# Patient Record
Sex: Male | Born: 1965 | Race: White | Hispanic: No | Marital: Married | Smoking: Former smoker
Health system: Southern US, Community
[De-identification: ages and names within clinical notes are randomized; demographics above are authoritative.]

## PROBLEM LIST (undated history)

## (undated) DIAGNOSIS — I2510 Atherosclerotic heart disease of native coronary artery without angina pectoris: Secondary | ICD-10-CM

## (undated) DIAGNOSIS — M5126 Other intervertebral disc displacement, lumbar region: Secondary | ICD-10-CM

## (undated) DIAGNOSIS — M5414 Radiculopathy, thoracic region: Secondary | ICD-10-CM

## (undated) DIAGNOSIS — E119 Type 2 diabetes mellitus without complications: Secondary | ICD-10-CM

## (undated) DIAGNOSIS — G629 Polyneuropathy, unspecified: Secondary | ICD-10-CM

## (undated) DIAGNOSIS — E785 Hyperlipidemia, unspecified: Secondary | ICD-10-CM

## (undated) DIAGNOSIS — G4730 Sleep apnea, unspecified: Secondary | ICD-10-CM

## (undated) DIAGNOSIS — M549 Dorsalgia, unspecified: Secondary | ICD-10-CM

## (undated) DIAGNOSIS — M5417 Radiculopathy, lumbosacral region: Secondary | ICD-10-CM

## (undated) DIAGNOSIS — R945 Abnormal results of liver function studies: Secondary | ICD-10-CM

## (undated) DIAGNOSIS — M48061 Spinal stenosis, lumbar region without neurogenic claudication: Secondary | ICD-10-CM

## (undated) DIAGNOSIS — F329 Major depressive disorder, single episode, unspecified: Secondary | ICD-10-CM

## (undated) DIAGNOSIS — M5137 Other intervertebral disc degeneration, lumbosacral region: Secondary | ICD-10-CM

## (undated) DIAGNOSIS — I10 Essential (primary) hypertension: Secondary | ICD-10-CM

## (undated) HISTORY — DX: Dorsalgia, unspecified: M54.9

## (undated) HISTORY — DX: Radiculopathy, thoracic region: M54.14

## (undated) HISTORY — DX: Essential (primary) hypertension: I10

## (undated) HISTORY — DX: Other intervertebral disc degeneration, lumbosacral region: M51.37

## (undated) HISTORY — DX: Radiculopathy, lumbosacral region: M54.17

## (undated) HISTORY — DX: Type 2 diabetes mellitus without complications: E11.9

## (undated) HISTORY — DX: Spinal stenosis, lumbar region without neurogenic claudication: M48.061

## (undated) HISTORY — DX: Other intervertebral disc displacement, lumbar region: M51.26

## (undated) HISTORY — DX: Major depressive disorder, single episode, unspecified: F32.9

## (undated) HISTORY — PX: OTHER SURGICAL HISTORY: SHX169

## (undated) HISTORY — DX: Polyneuropathy, unspecified: G62.9

## (undated) HISTORY — DX: Hyperlipidemia, unspecified: E78.5

## (undated) HISTORY — DX: Abnormal results of liver function studies: R94.5

## (undated) HISTORY — DX: Atherosclerotic heart disease of native coronary artery without angina pectoris: I25.10

---

## 1998-08-23 HISTORY — PX: BACK SURGERY: SHX140

## 1999-02-26 ENCOUNTER — Inpatient Hospital Stay (HOSPITAL_COMMUNITY): Admission: RE | Admit: 1999-02-26 | Discharge: 1999-02-27 | Payer: Self-pay | Admitting: Neurosurgery

## 1999-02-26 ENCOUNTER — Encounter: Payer: Self-pay | Admitting: Neurosurgery

## 2004-08-23 HISTORY — PX: OTHER SURGICAL HISTORY: SHX169

## 2004-09-18 ENCOUNTER — Ambulatory Visit: Payer: Self-pay

## 2004-09-25 ENCOUNTER — Ambulatory Visit: Payer: Self-pay | Admitting: Unknown Physician Specialty

## 2005-01-17 ENCOUNTER — Emergency Department: Payer: Self-pay | Admitting: Emergency Medicine

## 2006-01-23 ENCOUNTER — Emergency Department: Payer: Self-pay | Admitting: Emergency Medicine

## 2008-12-03 ENCOUNTER — Emergency Department: Payer: Self-pay | Admitting: Unknown Physician Specialty

## 2009-07-06 ENCOUNTER — Emergency Department: Payer: Self-pay | Admitting: Emergency Medicine

## 2011-05-16 ENCOUNTER — Emergency Department: Payer: Self-pay | Admitting: Emergency Medicine

## 2011-06-07 ENCOUNTER — Observation Stay: Payer: Self-pay | Admitting: Internal Medicine

## 2011-06-08 ENCOUNTER — Emergency Department: Payer: Self-pay | Admitting: Internal Medicine

## 2011-06-17 ENCOUNTER — Ambulatory Visit: Payer: Self-pay | Admitting: Cardiology

## 2011-06-17 HISTORY — PX: CORONARY ANGIOPLASTY WITH STENT PLACEMENT: SHX49

## 2011-11-23 ENCOUNTER — Ambulatory Visit: Payer: Self-pay | Admitting: General Practice

## 2011-12-21 ENCOUNTER — Ambulatory Visit: Payer: Self-pay | Admitting: General Practice

## 2012-11-13 ENCOUNTER — Emergency Department: Payer: Self-pay | Admitting: Emergency Medicine

## 2012-11-15 ENCOUNTER — Ambulatory Visit: Payer: Self-pay | Admitting: Orthopedic Surgery

## 2012-12-04 DIAGNOSIS — E785 Hyperlipidemia, unspecified: Secondary | ICD-10-CM

## 2012-12-04 DIAGNOSIS — R7989 Other specified abnormal findings of blood chemistry: Secondary | ICD-10-CM | POA: Insufficient documentation

## 2012-12-04 DIAGNOSIS — F329 Major depressive disorder, single episode, unspecified: Secondary | ICD-10-CM | POA: Insufficient documentation

## 2012-12-04 DIAGNOSIS — G629 Polyneuropathy, unspecified: Secondary | ICD-10-CM | POA: Insufficient documentation

## 2012-12-04 DIAGNOSIS — E119 Type 2 diabetes mellitus without complications: Secondary | ICD-10-CM | POA: Insufficient documentation

## 2012-12-04 DIAGNOSIS — R569 Unspecified convulsions: Secondary | ICD-10-CM | POA: Insufficient documentation

## 2012-12-04 DIAGNOSIS — M545 Low back pain, unspecified: Secondary | ICD-10-CM | POA: Insufficient documentation

## 2012-12-04 DIAGNOSIS — I2510 Atherosclerotic heart disease of native coronary artery without angina pectoris: Secondary | ICD-10-CM

## 2012-12-04 DIAGNOSIS — R945 Abnormal results of liver function studies: Secondary | ICD-10-CM

## 2012-12-04 DIAGNOSIS — I10 Essential (primary) hypertension: Secondary | ICD-10-CM

## 2012-12-04 DIAGNOSIS — E871 Hypo-osmolality and hyponatremia: Secondary | ICD-10-CM | POA: Insufficient documentation

## 2012-12-04 DIAGNOSIS — F32A Depression, unspecified: Secondary | ICD-10-CM | POA: Insufficient documentation

## 2012-12-04 DIAGNOSIS — M549 Dorsalgia, unspecified: Secondary | ICD-10-CM | POA: Insufficient documentation

## 2012-12-04 HISTORY — DX: Dorsalgia, unspecified: M54.9

## 2012-12-04 HISTORY — DX: Essential (primary) hypertension: I10

## 2012-12-04 HISTORY — DX: Other specified abnormal findings of blood chemistry: R79.89

## 2012-12-04 HISTORY — DX: Abnormal results of liver function studies: R94.5

## 2012-12-04 HISTORY — DX: Depression, unspecified: F32.A

## 2012-12-04 HISTORY — DX: Atherosclerotic heart disease of native coronary artery without angina pectoris: I25.10

## 2012-12-04 HISTORY — DX: Hyperlipidemia, unspecified: E78.5

## 2012-12-04 HISTORY — DX: Polyneuropathy, unspecified: G62.9

## 2012-12-11 DIAGNOSIS — M51379 Other intervertebral disc degeneration, lumbosacral region without mention of lumbar back pain or lower extremity pain: Secondary | ICD-10-CM

## 2012-12-11 DIAGNOSIS — M5137 Other intervertebral disc degeneration, lumbosacral region: Secondary | ICD-10-CM

## 2012-12-11 DIAGNOSIS — M5126 Other intervertebral disc displacement, lumbar region: Secondary | ICD-10-CM | POA: Insufficient documentation

## 2012-12-11 DIAGNOSIS — M5414 Radiculopathy, thoracic region: Secondary | ICD-10-CM

## 2012-12-11 DIAGNOSIS — M48061 Spinal stenosis, lumbar region without neurogenic claudication: Secondary | ICD-10-CM | POA: Insufficient documentation

## 2012-12-11 DIAGNOSIS — IMO0002 Reserved for concepts with insufficient information to code with codable children: Secondary | ICD-10-CM | POA: Insufficient documentation

## 2012-12-11 HISTORY — DX: Other intervertebral disc degeneration, lumbosacral region: M51.37

## 2012-12-11 HISTORY — DX: Other intervertebral disc displacement, lumbar region: M51.26

## 2012-12-11 HISTORY — DX: Radiculopathy, thoracic region: M54.14

## 2012-12-11 HISTORY — DX: Other intervertebral disc degeneration, lumbosacral region without mention of lumbar back pain or lower extremity pain: M51.379

## 2012-12-11 HISTORY — DX: Spinal stenosis, lumbar region without neurogenic claudication: M48.061

## 2012-12-15 HISTORY — PX: OTHER SURGICAL HISTORY: SHX169

## 2013-06-26 ENCOUNTER — Ambulatory Visit: Payer: Self-pay | Admitting: Physician Assistant

## 2013-07-23 ENCOUNTER — Ambulatory Visit: Payer: Self-pay | Admitting: Physician Assistant

## 2013-08-01 ENCOUNTER — Ambulatory Visit: Payer: Self-pay | Admitting: General Practice

## 2013-08-23 ENCOUNTER — Ambulatory Visit: Payer: Self-pay | Admitting: Physician Assistant

## 2014-02-15 ENCOUNTER — Ambulatory Visit: Payer: Self-pay | Admitting: Podiatry

## 2014-02-22 ENCOUNTER — Other Ambulatory Visit: Payer: Self-pay | Admitting: *Deleted

## 2014-02-22 ENCOUNTER — Ambulatory Visit (INDEPENDENT_AMBULATORY_CARE_PROVIDER_SITE_OTHER): Payer: No Typology Code available for payment source | Admitting: Podiatry

## 2014-02-22 ENCOUNTER — Ambulatory Visit (INDEPENDENT_AMBULATORY_CARE_PROVIDER_SITE_OTHER): Payer: No Typology Code available for payment source

## 2014-02-22 ENCOUNTER — Encounter: Payer: Self-pay | Admitting: Podiatry

## 2014-02-22 VITALS — BP 121/83 | HR 97 | Resp 16 | Ht 72.0 in | Wt 245.0 lb

## 2014-02-22 DIAGNOSIS — M779 Enthesopathy, unspecified: Secondary | ICD-10-CM

## 2014-02-22 DIAGNOSIS — M2040 Other hammer toe(s) (acquired), unspecified foot: Secondary | ICD-10-CM

## 2014-02-22 DIAGNOSIS — M2010 Hallux valgus (acquired), unspecified foot: Secondary | ICD-10-CM

## 2014-02-22 MED ORDER — TRIAMCINOLONE ACETONIDE 10 MG/ML IJ SUSP
10.0000 mg | Freq: Once | INTRAMUSCULAR | Status: AC
  Administered 2014-02-22: 10 mg

## 2014-02-22 MED ORDER — DICLOFENAC SODIUM 75 MG PO TBEC: 75.0000 mg | DELAYED_RELEASE_TABLET | Freq: Two times a day (BID) | ORAL | Status: DC

## 2014-02-22 NOTE — Progress Notes (Signed)
   Subjective:    Patient ID: Christopher Dennis, male    DOB: 1965/11/09, 48 y.o.   MRN: [id number]  HPI Comments: Both of my feet hurt hurt in the front and back. 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 My feet are getting worse. It hurts when i stand, shoes hurt. i have not done anything for my feet.  Foot Pain Associated symptoms include fatigue and a rash.      Review of Systems  Constitutional: Positive for appetite change, fatigue and unexpected weight change.       Sweating  Musculoskeletal:       Joint pain Back pain Difficulty walking  Skin: Positive for rash.       Change in nails  Allergic/Immunologic: Positive for environmental allergies.  Hematological:       Slow to heal  All other systems reviewed and are negative.      Objective:   Physical Exam        Assessment & Plan:

## 2014-02-22 NOTE — Progress Notes (Signed)
Subjective:     Patient ID: Christopher Dennis, male   DOB: 1965-12-28, 48 y.o.   MRN: [id number]  Foot Pain   patient states my left foot is really hurting me and both feet hurt me at times. States the second toe has lifted in the last few months and has making the pain worse in my forefoot and I have a long family history of this problem and also the bunion is becoming sore   Review of Systems  All other systems reviewed and are negative.      Objective:   Physical Exam  Nursing note and vitals reviewed. Constitutional: He is oriented to person, place, and time.  Cardiovascular: Intact distal pulses.   Musculoskeletal: Normal range of motion.  Neurological: He is oriented to person, place, and time.  Skin: Skin is warm.   neurovascular status is found to be intact with muscle strength adequate and range of motion subtalar midtarsal joint within normal limits. 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 Today I focused in on the second MPJ and I did do a proximal nerve block aspirated the joint giving out a small amount of clear fluid and injected with half cc of dexamethasone Kenalog combination and applied thick metatarsal pad. Did discuss ultimate surgery with digital fusion digits 234 structural bunion correction and probable shortening osteotomy the second metatarsal left. Reappoint in 3 weeks to discuss reaction to the medication and whether it helped and  whether orthotics or possible surgery is going to be in the near future

## 2014-03-15 ENCOUNTER — Ambulatory Visit: Payer: No Typology Code available for payment source | Admitting: Podiatry

## 2014-08-09 ENCOUNTER — Ambulatory Visit: Payer: Self-pay | Admitting: Orthopedic Surgery

## 2014-09-12 ENCOUNTER — Ambulatory Visit: Payer: Self-pay | Admitting: Orthopedic Surgery

## 2014-09-12 DIAGNOSIS — I10 Essential (primary) hypertension: Secondary | ICD-10-CM

## 2014-09-12 LAB — CBC WITH DIFFERENTIAL/PLATELET
BASOS ABS: 0.1 10*3/uL (ref 0.0–0.1)
Basophil %: 0.9 %
Eosinophil #: 0.2 10*3/uL (ref 0.0–0.7)
Eosinophil %: 2.9 %
HCT: 46.4 % (ref 40.0–52.0)
HGB: 15.1 g/dL (ref 13.0–18.0)
LYMPHS ABS: 2.1 10*3/uL (ref 1.0–3.6)
Lymphocyte %: 33.5 %
MCH: 30.4 pg (ref 26.0–34.0)
MCHC: 32.4 g/dL (ref 32.0–36.0)
MCV: 94 fL (ref 80–100)
MONOS PCT: 7.4 %
Monocyte #: 0.5 x10 3/mm (ref 0.2–1.0)
Neutrophil #: 3.4 10*3/uL (ref 1.4–6.5)
Neutrophil %: 55.3 %
Platelet: 142 10*3/uL — ABNORMAL LOW (ref 150–440)
RBC: 4.95 10*6/uL (ref 4.40–5.90)
RDW: 13 % (ref 11.5–14.5)
WBC: 6.1 10*3/uL (ref 3.8–10.6)

## 2014-09-19 ENCOUNTER — Ambulatory Visit: Payer: Self-pay | Admitting: Orthopedic Surgery

## 2014-09-19 HISTORY — PX: KNEE ARTHROSCOPY: SUR90

## 2014-11-22 ENCOUNTER — Emergency Department
Admit: 2014-11-22 | Disposition: A | Discharge status: Home / Self Care | Payer: Self-pay | Admitting: Emergency Medicine

## 2014-12-22 NOTE — Op Note (Signed)
PATIENT NAME:  Christopher Dennis, DORO MR#:  [id number] DATE OF BIRTH:  December 02, 1965  DATE OF PROCEDURE:  09/19/2014  PREOPERATIVE DIAGNOSIS:  Right knee osteoarthritis, medial and lateral meniscus tears.   POSTOPERATIVE DIAGNOSIS:  Right knee osteoarthritis, medial and lateral meniscus tears.   PROCEDURE:  Right knee arthroscopy, partial medial and extensive lateral meniscectomy.   ANESTHESIA:  General.   SURGEON:  Laurene Footman, MD   DESCRIPTION OF PROCEDURE:  The patient was brought to the operating room, and after adequate anesthesia was obtained, the right leg was prepped and draped in the usual sterile fashion with the arthroscopic leg holder and tourniquet applied to the right upper thigh.  After patient identification and timeout procedure were completed, an inferolateral portal was made and the arthroscope was introduced. Initial inspection revealed normal patellofemoral joint with mild synovitis in the suprapatellar pouch. No significant plica band. Coming around medially, an inferomedial portal was made and on probing there was a tear of the junction of the middle and posterior thirds consistent with the MRI findings with areas of mild partial-thickness cartilage loss on the femoral condyle and minimal on the tibial condyle. The ACL was intact. The lateral compartment had essentially a bucket-handle tear of the anterior horn of the lateral meniscus, a horizontal tear in the posterior third, and an extensive tear in the middle third as well, which was more radial. After using first a meniscal punch, a shaver was used to debride large fragments and then a wand used to smooth the edges. After adequate resection of both meniscus tears, the gutters were checked. There were no loose bodies. The knee was irrigated until clear. Additionally, in the lateral condyle, there was extensive fissuring and grade 3 changes to both femoral and tibial condyles, especially in the central tibia. Preoperative and  postoperative pictures were taken. The arthroscope was withdrawn along with all instrumentation. The wounds were closed with simple interrupted 4-0 nylon, and 20 mL of 0.5% Sensorcaine were infiltrated in the portals for postoperative analgesia. Xeroform, 4 x 4's, Webril, and Ace wrap were applied. The patient was sent to the recovery room in stable condition.   ESTIMATED BLOOD LOSS:  Minimal.   COMPLICATIONS:  None.   SPECIMEN:  None.    ____________________________ Laurene Footman, MD mjm:nb D: 09/19/2014 19:07:14 ET T: 09/20/2014 02:25:31 ET JOB#: [id number]  cc: Laurene Footman, MD, <Dictator> Laurene Footman MD ELECTRONICALLY SIGNED 09/20/2014 10:01

## 2015-04-29 ENCOUNTER — Encounter: Payer: Self-pay | Admitting: Gastroenterology

## 2015-04-29 ENCOUNTER — Telehealth: Payer: Self-pay

## 2015-04-29 ENCOUNTER — Ambulatory Visit (INDEPENDENT_AMBULATORY_CARE_PROVIDER_SITE_OTHER): Payer: PRIVATE HEALTH INSURANCE | Admitting: Gastroenterology

## 2015-04-29 VITALS — BP 121/83 | HR 101 | Temp 98.3°F | Ht 72.0 in | Wt 241.0 lb

## 2015-04-29 DIAGNOSIS — R1314 Dysphagia, pharyngoesophageal phase: Secondary | ICD-10-CM

## 2015-04-29 MED ORDER — NA SULFATE-K SULFATE-MG SULF 17.5-3.13-1.6 GM/177ML PO SOLN: 1.0000 | ORAL | Status: DC

## 2015-04-29 NOTE — Telephone Encounter (Signed)
Can you please schedule his Screening Colonoscopy and EGD for Dysphagia. I was able to send his prescription but not able to order or schedule his procedures.  I gave him his paperwork and explained procedures. I told him that you would call him with the appointment date. Thank you.

## 2015-04-29 NOTE — Progress Notes (Signed)
Gastroenterology Consultation  Referring Provider:     Versie Starks, PA-C Primary Care Physician:  Versie Starks, PA-C Primary Gastroenterologist:  Dr. Allen Norris     Reason for Consultation:     Dysphagia        HPI:   Christopher Dennis is a 50 y.o. y/o male referred for consultation & management of dysphagia by Dr. Versie Starks, PA-C.  This patient comes in today with a history of dysphagia. The patient reports that is worse with pulled pork and gravy but usually not with soft foods. The patient states this is been going on for many years. He denies ever having to go to the emergency room or having upper endoscopy to evaluate this. The patient states that sometimes he has to go to the bathroom and threw up the food. 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 There is no report of any black stools or bloody stools. The patient also reports that he has never had a colonoscopy. The patient denies any unexplained weight loss all although he has lost 11 pounds over the last year but he states he was trying. He denies any abdominal pain associated with his dysphagia. He also denies any episodes of choking where he loses his breath.  Past Medical History  Diagnosis Date  . Diabetes   . Arteriosclerosis of coronary artery 12/04/2012  . BP (high blood pressure) 12/04/2012  . Neuropathy 12/04/2012  . 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 DDD (degenerative disc disease), lumbosacral 12/11/2012  . Displacement of lumbar intervertebral disc without myelopathy 12/11/2012  . Back ache 12/04/2012  . Clinical depression 12/04/2012  . Abnormal LFTs 12/04/2012  . HLD (hyperlipidemia) 12/04/2012  . Lumbar canal stenosis 12/11/2012    Past Surgical History  Procedure Laterality Date  . Back surgery  2000  . Meniscus tear  2006  . Coronary angioplasty with stent placement    . Laminectomy posterior lumbar facetectomy & foraminotomy w/decomp  12/15/2012  . Laminectomy posterior  cervicle decomp    . Knee arthroscopy Right 09/19/2014  . Partial medial and extensive lateral meniscectomy      Prior to Admission medications   Medication Sig Start Date End Date Taking? Authorizing Provider  clopidogrel (PLAVIX) 75 MG tablet Take 75 mg by mouth daily.   Yes Historical Provider, MD  glipiZIDE (GLUCOTROL) 5 MG tablet Take 1 tablet by mouth 2 (two) times daily. 02/28/15  Yes Historical Provider, MD  lisinopril (PRINIVIL,ZESTRIL) 20 MG tablet Take 1 tablet by mouth 1 day or 1 dose. 04/09/15  Yes Historical Provider, MD  Loratadine 10 MG CAPS Take by mouth.   Yes Historical Provider, MD  metFORMIN (GLUCOPHAGE) 1000 MG tablet Take by mouth.   Yes Historical Provider, MD  simvastatin (ZOCOR) 20 MG tablet Take by mouth.   Yes Historical Provider, MD  Vitamin D, Ergocalciferol, (DRISDOL) 50000 UNITS CAPS capsule Take by mouth.   Yes Historical Provider, MD    Family History  Problem Relation Age of Onset  . Diabetes Mother   . COPD Mother   . Alcohol abuse Father   . Asthma Sister      Social History  Substance Use Topics  . Smoking status: Former Smoker    Quit date: 01/21/2009  . Smokeless tobacco: Current User    Types: Snuff  . Alcohol Use: 0.0 oz/week    0 Standard drinks or equivalent per week     Comment: rarely    Allergies  as of 04/29/2015  . (No Known Allergies)    Review of Systems:    All systems reviewed and negative except where noted in HPI.   Physical Exam:  BP 121/83 mmHg  Pulse 101  Temp(Src) 98.3 F (36.8 C) (Oral)  Ht 6' (1.829 m)  Wt 241 lb (109.317 kg)  BMI 32.68 kg/m2 No LMP for male patient. Psych:  Alert and cooperative. Normal mood and affect. General:   Alert,  Well-developed, well-nourished, pleasant and cooperative in NAD Head:  Normocephalic and atraumatic. Eyes:  Sclera clear, no icterus.   Conjunctiva pink. Ears:  Normal auditory acuity. Nose:  No deformity, discharge, or lesions. Mouth:  No deformity or  lesions,oropharynx pink & moist. Neck:  Supple; no masses or thyromegaly. Lungs:  Respirations even and unlabored.  Clear throughout to auscultation.   No wheezes, crackles, or rhonchi. No acute distress. Heart:  Regular rate and rhythm; no murmurs, clicks, rubs, or gallops. Abdomen:  Normal bowel sounds.  No bruits.  Soft, non-tender and non-distended without masses, hepatosplenomegaly or hernias noted.  No guarding or rebound tenderness.  Negative Carnett sign.   Rectal:  Deferred.  Msk:  Symmetrical without gross deformities.  Good, equal movement & strength bilaterally. Pulses:  Normal pulses noted. Extremities:  No clubbing or edema.  No cyanosis. Neurologic:  Alert and oriented x3;  grossly normal neurologically. Skin:  Intact without significant lesions or rashes.  No jaundice. Lymph Nodes:  No significant cervical adenopathy. Psych:  Alert and cooperative. Normal mood and affect.  Imaging Studies: No results found.  Assessment and Plan:   Christopher Dennis is a 49 y.o. y/o male who comes in today with a history of dysphagia. The patient has had this for the last few years and its worse with solids than it is to liquids. The patient has lost 11 pounds but he states he has been trying to lose weight over the last year. The patient has not ever had an EGD and colonoscopy. The patient will be set up for an EGD and colonoscopy. The colonoscopy will be done for screening purposes.I have discussed risks & benefits which include, but are not limited to, bleeding, infection, perforation & drug reaction.  The patient agrees with this plan & written consent will be obtained.      Note: This dictation was prepared with Dragon dictation along with smaller phrase technology. Any transcriptional errors that result from this process are unintentional.

## 2015-04-29 NOTE — Addendum Note (Signed)
Addended by: Wayna Chalet on: 04/29/2015 02:41 PM   Modules accepted: Orders

## 2015-04-29 NOTE — Patient Instructions (Signed)
We will contacting you to let you know when your procedures will be scheduled.

## 2015-05-01 ENCOUNTER — Other Ambulatory Visit: Payer: Self-pay

## 2015-05-01 NOTE — Telephone Encounter (Signed)
Pt has been scheduled for his procedures at [facility] on Friday, Sept 16th.

## 2015-05-07 ENCOUNTER — Encounter: Payer: Self-pay | Admitting: *Deleted

## 2015-05-09 NOTE — Discharge Instructions (Signed)

## 2015-05-12 ENCOUNTER — Ambulatory Visit: Payer: PRIVATE HEALTH INSURANCE | Admitting: Student in an Organized Health Care Education/Training Program

## 2015-05-12 ENCOUNTER — Other Ambulatory Visit: Payer: Self-pay | Admitting: Gastroenterology

## 2015-05-12 ENCOUNTER — Encounter
Admission: RE | Disposition: A | Discharge status: Home / Self Care | Payer: Self-pay | Source: Ambulatory Visit | Attending: Gastroenterology

## 2015-05-12 ENCOUNTER — Ambulatory Visit
Admission: RE | Admit: 2015-05-12 | Discharge: 2015-05-12 | Disposition: A | Discharge status: Home / Self Care | Payer: PRIVATE HEALTH INSURANCE | Source: Ambulatory Visit | Attending: Gastroenterology | Admitting: Gastroenterology

## 2015-05-12 DIAGNOSIS — K5730 Diverticulosis of large intestine without perforation or abscess without bleeding: Secondary | ICD-10-CM | POA: Diagnosis not present

## 2015-05-12 DIAGNOSIS — R7989 Other specified abnormal findings of blood chemistry: Secondary | ICD-10-CM | POA: Diagnosis not present

## 2015-05-12 DIAGNOSIS — K222 Esophageal obstruction: Secondary | ICD-10-CM | POA: Diagnosis not present

## 2015-05-12 DIAGNOSIS — M5417 Radiculopathy, lumbosacral region: Secondary | ICD-10-CM | POA: Diagnosis not present

## 2015-05-12 DIAGNOSIS — K643 Fourth degree hemorrhoids: Secondary | ICD-10-CM | POA: Insufficient documentation

## 2015-05-12 DIAGNOSIS — D125 Benign neoplasm of sigmoid colon: Secondary | ICD-10-CM | POA: Diagnosis not present

## 2015-05-12 DIAGNOSIS — Z811 Family history of alcohol abuse and dependence: Secondary | ICD-10-CM | POA: Diagnosis not present

## 2015-05-12 DIAGNOSIS — Z87891 Personal history of nicotine dependence: Secondary | ICD-10-CM | POA: Insufficient documentation

## 2015-05-12 DIAGNOSIS — G4730 Sleep apnea, unspecified: Secondary | ICD-10-CM | POA: Insufficient documentation

## 2015-05-12 DIAGNOSIS — R0902 Hypoxemia: Secondary | ICD-10-CM | POA: Diagnosis not present

## 2015-05-12 DIAGNOSIS — M5414 Radiculopathy, thoracic region: Secondary | ICD-10-CM | POA: Diagnosis not present

## 2015-05-12 DIAGNOSIS — K2970 Gastritis, unspecified, without bleeding: Secondary | ICD-10-CM | POA: Insufficient documentation

## 2015-05-12 DIAGNOSIS — Z79899 Other long term (current) drug therapy: Secondary | ICD-10-CM | POA: Insufficient documentation

## 2015-05-12 DIAGNOSIS — M4806 Spinal stenosis, lumbar region: Secondary | ICD-10-CM | POA: Diagnosis not present

## 2015-05-12 DIAGNOSIS — I2510 Atherosclerotic heart disease of native coronary artery without angina pectoris: Secondary | ICD-10-CM | POA: Diagnosis not present

## 2015-05-12 DIAGNOSIS — F329 Major depressive disorder, single episode, unspecified: Secondary | ICD-10-CM | POA: Diagnosis not present

## 2015-05-12 DIAGNOSIS — E785 Hyperlipidemia, unspecified: Secondary | ICD-10-CM | POA: Diagnosis not present

## 2015-05-12 DIAGNOSIS — Z1211 Encounter for screening for malignant neoplasm of colon: Secondary | ICD-10-CM | POA: Insufficient documentation

## 2015-05-12 DIAGNOSIS — Z833 Family history of diabetes mellitus: Secondary | ICD-10-CM | POA: Diagnosis not present

## 2015-05-12 DIAGNOSIS — M549 Dorsalgia, unspecified: Secondary | ICD-10-CM | POA: Insufficient documentation

## 2015-05-12 DIAGNOSIS — M5137 Other intervertebral disc degeneration, lumbosacral region: Secondary | ICD-10-CM | POA: Diagnosis not present

## 2015-05-12 DIAGNOSIS — E119 Type 2 diabetes mellitus without complications: Secondary | ICD-10-CM | POA: Diagnosis not present

## 2015-05-12 DIAGNOSIS — Z955 Presence of coronary angioplasty implant and graft: Secondary | ICD-10-CM | POA: Diagnosis not present

## 2015-05-12 DIAGNOSIS — R1310 Dysphagia, unspecified: Secondary | ICD-10-CM | POA: Diagnosis not present

## 2015-05-12 DIAGNOSIS — Z825 Family history of asthma and other chronic lower respiratory diseases: Secondary | ICD-10-CM | POA: Insufficient documentation

## 2015-05-12 DIAGNOSIS — K644 Residual hemorrhoidal skin tags: Secondary | ICD-10-CM | POA: Diagnosis not present

## 2015-05-12 HISTORY — PX: POLYPECTOMY: SHX5525

## 2015-05-12 HISTORY — DX: Sleep apnea, unspecified: G47.30

## 2015-05-12 HISTORY — PX: COLONOSCOPY WITH PROPOFOL: SHX5780

## 2015-05-12 HISTORY — PX: ESOPHAGOGASTRODUODENOSCOPY (EGD) WITH PROPOFOL: SHX5813

## 2015-05-12 LAB — GLUCOSE, CAPILLARY
Glucose-Capillary: 284 mg/dL — ABNORMAL HIGH (ref 65–99)
Glucose-Capillary: 321 mg/dL — ABNORMAL HIGH (ref 65–99)
Glucose-Capillary: 242 mg/dL — ABNORMAL HIGH (ref 65–99)

## 2015-05-12 SURGERY — COLONOSCOPY WITH PROPOFOL
Anesthesia: Monitor Anesthesia Care | Wound class: Contaminated

## 2015-05-12 MED ORDER — PROPOFOL 10 MG/ML IV BOLUS
INTRAVENOUS | Status: DC | PRN
  Administered 2015-05-12: 50 mg via INTRAVENOUS
  Administered 2015-05-12 (×3): 40 mg via INTRAVENOUS
  Administered 2015-05-12: 100 mg via INTRAVENOUS
  Administered 2015-05-12: 30 mg via INTRAVENOUS
  Administered 2015-05-12: 80 mg via INTRAVENOUS
  Administered 2015-05-12: 20 mg via INTRAVENOUS

## 2015-05-12 MED ORDER — ACETAMINOPHEN 325 MG PO TABS: 325.0000 mg | ORAL_TABLET | ORAL | Status: DC | PRN

## 2015-05-12 MED ORDER — ACETAMINOPHEN 160 MG/5ML PO SOLN: 325.0000 mg | ORAL | Status: DC | PRN

## 2015-05-12 MED ORDER — SIMETHICONE 40 MG/0.6ML PO SUSP
ORAL | Status: DC | PRN
  Administered 2015-05-12: 09:00:00

## 2015-05-12 MED ORDER — LIDOCAINE HCL (CARDIAC) 20 MG/ML IV SOLN
INTRAVENOUS | Status: DC | PRN
  Administered 2015-05-12: 30 mg via INTRAVENOUS

## 2015-05-12 MED ORDER — LACTATED RINGERS IV SOLN: INTRAVENOUS | Status: DC

## 2015-05-12 MED ORDER — GLYCOPYRROLATE 0.2 MG/ML IJ SOLN
INTRAMUSCULAR | Status: DC | PRN
  Administered 2015-05-12: 0.2 mg via INTRAVENOUS

## 2015-05-12 MED ORDER — LACTATED RINGERS IV SOLN
INTRAVENOUS | Status: DC
  Administered 2015-05-12 (×3): via INTRAVENOUS

## 2015-05-12 SURGICAL SUPPLY — 39 items
BALLN DILATOR 10-12 8 (BALLOONS) ×1
BALLN DILATOR 12-15 8 (BALLOONS)
BALLN DILATOR 15-18 8 (BALLOONS)
BALLN DILATOR CRE 0-12 8 (BALLOONS) ×2
BALLN DILATOR ESOPH 8 10 CRE (MISCELLANEOUS) IMPLANT
BALLOON DILATOR 12-15 8 (BALLOONS) IMPLANT
BALLOON DILATOR 15-18 8 (BALLOONS) IMPLANT
BALLOON DILATOR CRE 0-12 8 (BALLOONS) IMPLANT
BLOCK BITE 60FR ADLT L/F GRN (MISCELLANEOUS) ×3 IMPLANT
CANISTER SUCT 1200ML W/VALVE (MISCELLANEOUS) ×3 IMPLANT
FCP ESCP3.2XJMB 240X2.8X (MISCELLANEOUS)
FORCEPS BIOP RAD 4 LRG CAP 4 (CUTTING FORCEPS) ×1 IMPLANT
FORCEPS BIOP RJ4 240 W/NDL (MISCELLANEOUS)
FORCEPS ESCP3.2XJMB 240X2.8X (MISCELLANEOUS) IMPLANT
GOWN CVR UNV OPN BCK APRN NK (MISCELLANEOUS) ×4 IMPLANT
GOWN ISOL THUMB LOOP REG UNIV (MISCELLANEOUS) ×6
HEMOCLIP INSTINCT (CLIP) IMPLANT
INJECTOR VARIJECT VIN23 (MISCELLANEOUS) IMPLANT
KIT CO2 TUBING (TUBING) IMPLANT
KIT DEFENDO VALVE AND CONN (KITS) IMPLANT
KIT ENDO PROCEDURE OLY (KITS) ×3 IMPLANT
LIGATOR MULTIBAND 6SHOOTER MBL (MISCELLANEOUS) IMPLANT
MARKER SPOT ENDO TATTOO 5ML (MISCELLANEOUS) IMPLANT
PAD GROUND ADULT SPLIT (MISCELLANEOUS) IMPLANT
SNARE SHORT THROW 13M SML OVAL (MISCELLANEOUS) ×1 IMPLANT
SNARE SHORT THROW 30M LRG OVAL (MISCELLANEOUS) IMPLANT
SPOT EX ENDOSCOPIC TATTOO (MISCELLANEOUS)
SUCTION POLY TRAP 4CHAMBER (MISCELLANEOUS) IMPLANT
SYR INFLATION 60ML (SYRINGE) ×1 IMPLANT
TRAP SUCTION POLY (MISCELLANEOUS) ×1 IMPLANT
TUBING CONN 6MMX3.1M (TUBING)
TUBING SUCTION CONN 0.25 STRL (TUBING) IMPLANT
UNDERPAD 30X60 958B10 (PK) (MISCELLANEOUS) IMPLANT
VALVE BIOPSY ENDO (VALVE) IMPLANT
VARIJECT INJECTOR VIN23 (MISCELLANEOUS)
WATER AUXILLARY (MISCELLANEOUS) IMPLANT
WATER STERILE IRR 250ML POUR (IV SOLUTION) ×3 IMPLANT
WATER STERILE IRR 500ML POUR (IV SOLUTION) IMPLANT
WIRE CRE 18-20MM 8CM F G (MISCELLANEOUS) IMPLANT

## 2015-05-12 NOTE — Op Note (Signed)
[facility] Gastroenterology Patient Name: Christopher Dennis Procedure Date: 05/12/2015 8:31 AM MRN: [id number] Account #: [id number] Date of Birth: 16-Dec-1965 Admit Type: Outpatient Age: 49 Room: [facility] OR ROOM 01 Gender: Male Note Status: Finalized Procedure:         Colonoscopy Indications:       Screening for colorectal malignant neoplasm Providers:         Lucilla Lame, MD Referring MD:      Versie Starks, MD (Referring MD) Medicines:         Propofol per Anesthesia Complications:     Hypoxia Procedure:         Pre-Anesthesia Assessment:                    - Prior to the procedure, a History and Physical was                     performed, and patient medications and allergies were                     reviewed. The patient's tolerance of previous anesthesia                     was also reviewed. The risks and benefits of the procedure                     and the sedation options and risks were discussed with the                     patient. All questions were answered, and informed consent                     was obtained. Prior Anticoagulants: The patient has taken                     no previous anticoagulant or antiplatelet agents. ASA                     Grade Assessment: II - A patient with mild systemic                     disease. After reviewing the risks and benefits, the                     patient was deemed in satisfactory condition to undergo                     the procedure.                    After obtaining informed consent, the colonoscope was                     passed under direct vision. Throughout the procedure, the                     patient's blood pressure, pulse, and oxygen saturations                     were monitored continuously. The Olympus CF H180AL                     colonoscope (S#: [id number]) was introduced through the anus  and advanced to the the cecum, identified by appendiceal                     orifice  and ileocecal valve. The colonoscopy was performed                     without difficulty. The patient tolerated the procedure                     well. The quality of the bowel preparation was excellent. Findings:      The perianal exam findings include non-thrombosed external hemorrhoids,       non-thrombosed internal hemorrhoids and internal hemorrhoids that do not       return to the anal canal, thus continuously prolapsed (Grade IV).      Two sessile polyps were found in the sigmoid colon. The polyps were 4 to       5 mm in size. These polyps were removed with a cold snare. Resection and       retrieval were complete.      A few small-mouthed diverticula were found in the sigmoid colon. Impression:        - Non-thrombosed external hemorrhoids, non-thrombosed                     internal hemorrhoids and internal hemorrhoids that do not                     return to the anal canal, thus continuously prolapsed                     (Grade IV) found on perianal exam.                    - Two 4 to 5 mm polyps in the sigmoid colon. Resected and                     retrieved.                    - Diverticulosis in the sigmoid colon. Recommendation:    - Await pathology results.                    - Repeat colonoscopy in 5 years if polyp adenoma and 10                     years if hyperplastic Procedure Code(s): --- Professional ---                    [phone], Colonoscopy, flexible; with removal of tumor(s),                     polyp(s), or other lesion(s) by snare technique Diagnosis Code(s): --- Professional ---                    Z12.11, Encounter for screening for malignant neoplasm of                     colon                    D12.5, Benign neoplasm of sigmoid colon                    K64.3, Fourth degree hemorrhoids CPT copyright 2014 American Medical  Association. All rights reserved. The codes documented in this report are preliminary and upon coder review may  be revised to meet  current compliance requirements. Lucilla Lame, MD 05/12/2015 9:09:25 AM This report has been signed electronically. Number of Addenda: 0 Note Initiated On: 05/12/2015 8:31 AM Scope Withdrawal Time: 0 hours 5 minutes 38 seconds  Total Procedure Duration: 0 hours 13 minutes 30 seconds       [facility]

## 2015-05-12 NOTE — Anesthesia Procedure Notes (Signed)
Procedure Name: MAC Performed by: LEBLANC, MONIQUE Pre-anesthesia Checklist: Patient identified, Emergency Drugs available, Suction available, Patient being monitored and Timeout performed Patient Re-evaluated:Patient Re-evaluated prior to inductionOxygen Delivery Method: Nasal cannula       

## 2015-05-12 NOTE — Op Note (Signed)
[facility] Gastroenterology Patient Name: Christopher Dennis Procedure Date: 05/12/2015 8:31 AM MRN: [id number] Account #: [id number] Date of Birth: [birth year] Admit Type: Outpatient Age: 49 Room: [facility] OR ROOM 01 Gender: Male Note Status: Finalized Procedure:         Upper GI endoscopy Indications:       Dysphagia Providers:         Lucilla Lame, MD Referring MD:      Versie Starks, MD (Referring MD) Medicines:         Propofol per Anesthesia Complications:     No immediate complications. Procedure:         Pre-Anesthesia Assessment:                    - Prior to the procedure, a History and Physical was                     performed, and patient medications and allergies were                     reviewed. The patient's tolerance of previous anesthesia                     was also reviewed. The risks and benefits of the procedure                     and the sedation options and risks were discussed with the                     patient. All questions were answered, and informed consent                     was obtained. Prior Anticoagulants: The patient has taken                     no previous anticoagulant or antiplatelet agents. ASA                     Grade Assessment: II - A patient with mild systemic                     disease. After reviewing the risks and benefits, the                     patient was deemed in satisfactory condition to undergo                     the procedure.                    After obtaining informed consent, the endoscope was passed                     under direct vision. Throughout the procedure, the                     patient's blood pressure, pulse, and oxygen saturations                     were monitored continuously. The Olympus GIF H180J                     endoscope (S#: [id number]) was introduced through the mouth,  and advanced to the second part of duodenum. The upper GI                     endoscopy was  accomplished without difficulty. The patient                     tolerated the procedure well. Findings:      A benign-appearing, intrinsic moderate stenosis was found at the       gastroesophageal junction and was traversed. A TTS dilator was passed       through the scope. Dilation with a 15-16.5-18 mm balloon (to a maximum       balloon size of 18 mm) dilator was performed.      Localized moderate inflammation characterized by erythema was found in       the gastric antrum. Biopsies were taken with a cold forceps for       histology.      The examined duodenum was normal. Impression:        - Benign-appearing esophageal stricture. Dilated.                    - Gastritis. Biopsied.                    - Normal examined duodenum. Recommendation:    - Await pathology results.                    - Perform a colonoscopy today. Procedure Code(s): --- Professional ---                    [phone], Esophagogastroduodenoscopy, flexible, transoral;                     with transendoscopic balloon dilation of esophagus (less                     than 30 mm diameter)                    [id number], Esophagogastroduodenoscopy, flexible, transoral;                     with biopsy, single or multiple Diagnosis Code(s): --- Professional ---                    R13.10, Dysphagia, unspecified                    K29.70, Gastritis, unspecified, without bleeding                    K22.2, Esophageal obstruction CPT copyright 2014 American Medical Association. All rights reserved. The codes documented in this report are preliminary and upon coder review may  be revised to meet current compliance requirements. Lucilla Lame, MD 05/12/2015 8:49:13 AM This report has been signed electronically. Number of Addenda: 0 Note Initiated On: 05/12/2015 8:31 AM Total Procedure Duration: 0 hours 4 minutes 58 seconds       [facility]

## 2015-05-12 NOTE — Anesthesia Preprocedure Evaluation (Signed)
Anesthesia Evaluation  Patient identified by MRN, date of birth, ID band  Reviewed: Allergy & Precautions, H&P , NPO status , Patient's Chart, lab work & pertinent test results  Airway Mallampati: II  TM Distance: >3 FB Neck ROM: full    Dental no notable dental hx.    Pulmonary sleep apnea , former smoker,    Pulmonary exam normal        Cardiovascular hypertension, + CAD   Rhythm:regular Rate:Normal     Neuro/Psych    GI/Hepatic   Endo/Other  diabetes, Poorly Controlled, Type 2, Oral Hypoglycemic AgentsMorbid obesity  Renal/GU      Musculoskeletal   Abdominal   Peds  Hematology   Anesthesia Other Findings   Reproductive/Obstetrics                             Anesthesia Physical Anesthesia Plan  ASA: III  Anesthesia Plan: MAC   Post-op Pain Management:    Induction:   Airway Management Planned:   Additional Equipment:   Intra-op Plan:   Post-operative Plan:   Informed Consent: I have reviewed the patients History and Physical, chart, labs and discussed the procedure including the risks, benefits and alternatives for the proposed anesthesia with the patient or authorized representative who has indicated his/her understanding and acceptance.     Plan Discussed with: CRNA  Anesthesia Plan Comments:         Anesthesia Quick Evaluation

## 2015-05-12 NOTE — Transfer of Care (Signed)
Immediate Anesthesia Transfer of Care Note  Patient: Christopher Dennis  Procedure(s) Performed: Procedure(s) with comments: COLONOSCOPY WITH PROPOFOL (N/A) ESOPHAGOGASTRODUODENOSCOPY (EGD) WITH PROPOFOL (N/A) - CPAP  Patient Location: PACU  Anesthesia Type: MAC  Level of Consciousness: awake, alert  and patient cooperative  Airway and Oxygen Therapy: Patient Spontanous Breathing and Patient connected to supplemental oxygen  Post-op Assessment: Post-op Vital signs reviewed, Patient's Cardiovascular Status Stable, Respiratory Function Stable, Patent Airway and No signs of Nausea or vomiting  Post-op Vital Signs: Reviewed and stable  Complications: No apparent anesthesia complications

## 2015-05-12 NOTE — Anesthesia Postprocedure Evaluation (Signed)
  Anesthesia Post-op Note  Patient: Christopher Dennis  Procedure(s) Performed: Procedure(s) with comments: COLONOSCOPY WITH PROPOFOL (N/A) ESOPHAGOGASTRODUODENOSCOPY (EGD) WITH PROPOFOL (N/A) - CPAP POLYPECTOMY  Anesthesia type:MAC  Patient location: PACU  Post pain: Pain level controlled  Post assessment: Post-op Vital signs reviewed, Patient's Cardiovascular Status Stable, Respiratory Function Stable, Patent Airway and No signs of Nausea or vomiting  Post vital signs: Reviewed and stable  Last Vitals:  Filed Vitals:   05/12/15 0930  BP: 128/91  Pulse: 75  Temp:   Resp: 18    Level of consciousness: awake, alert  and patient cooperative  Complications: No apparent anesthesia complications

## 2015-05-12 NOTE — H&P (Signed)
[facility]  [address] [place], [place] [address] Phone: [phone] Fax : [phone]  Primary Care Physician:  Ashok Cordia, PA-C Primary Gastroenterologist:  Dr. Allen Norris  Pre-Procedure History & Physical: HPI:  Christopher Dennis is a 49 y.o. male is here for an endoscopy and colonoscopy.   Past Medical History  Diagnosis Date  . Diabetes   . Arteriosclerosis of coronary artery 12/04/2012  . BP (high blood pressure) 12/04/2012  . Neuropathy 12/04/2012  . 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 DDD (degenerative disc disease), lumbosacral 12/11/2012  . Displacement of lumbar intervertebral disc without myelopathy 12/11/2012  . Back ache 12/04/2012  . Clinical depression 12/04/2012  . Abnormal LFTs 12/04/2012  . HLD (hyperlipidemia) 12/04/2012  . Lumbar canal stenosis 12/11/2012  . Sleep apnea     CPAP - hasn't been using, no sure if it works    Past Surgical History  Procedure Laterality Date  . Back surgery  2000  . Meniscus tear  2006  . Laminectomy posterior lumbar facetectomy & foraminotomy w/decomp  12/15/2012  . Laminectomy posterior cervicle decomp    . Knee arthroscopy Right 09/19/2014  . Partial medial and extensive lateral meniscectomy    . Coronary angioplasty with stent placement  06/17/2011    Prior to Admission medications   Medication Sig Start Date End Date Taking? Authorizing Provider  amphetamine-dextroamphetamine (ADDERALL) 20 MG tablet Take 20 mg by mouth daily.   Yes Historical Provider, MD  clopidogrel (PLAVIX) 75 MG tablet Take 75 mg by mouth daily.   Yes Historical Provider, MD  glipiZIDE (GLUCOTROL) 5 MG tablet Take 1 tablet by mouth 2 (two) times daily. 02/28/15  Yes Historical Provider, MD  lisinopril (PRINIVIL,ZESTRIL) 20 MG tablet Take 1 tablet by mouth 1 day or 1 dose. 04/09/15  Yes Historical Provider, MD  Loratadine 10 MG CAPS Take by mouth.   Yes Historical Provider, MD  metFORMIN (GLUCOPHAGE) 1000 MG tablet Take  by mouth.   Yes Historical Provider, MD  Na Sulfate-K Sulfate-Mg Sulf (SUPREP BOWEL PREP) SOLN Take 1 kit by mouth as directed. 04/29/15  Yes Lucilla Lame, MD  simvastatin (ZOCOR) 20 MG tablet Take by mouth.   Yes Historical Provider, MD  Vitamin D, Ergocalciferol, (DRISDOL) 50000 UNITS CAPS capsule Take by mouth.   Yes Historical Provider, MD    Allergies as of 05/01/2015  . (No Known Allergies)    Family History  Problem Relation Age of Onset  . Diabetes Mother   . COPD Mother   . Alcohol abuse Father   . Asthma Sister     Social History   Social History  . Marital Status: Married    Spouse Name: N/A  . Number of Children: N/A  . Years of Education: N/A   Occupational History  . Not on file.   Social History Main Topics  . Smoking status: Former Smoker    Quit date: 01/21/2009  . Smokeless tobacco: Current User    Types: Snuff  . Alcohol Use: 0.0 oz/week    0 Standard drinks or equivalent per week     Comment: rarely - 1 beer/month  . Drug Use: No  . Sexual Activity: Not on file   Other Topics Concern  . Not on file   Social History Narrative    Review of Systems: See HPI, otherwise negative ROS  Physical Exam: BP 118/77 mmHg  Pulse 78  Temp(Src) 98.1 F (36.7 C) (Temporal)  Resp 16  Ht 6' (1.829 m)  Wt 234 lb (106.142 kg)  BMI 31.73 kg/m2  SpO2 97% General:   Alert,  pleasant and cooperative in NAD Head:  Normocephalic and atraumatic. Neck:  Supple; no masses or thyromegaly. Lungs:  Clear throughout to auscultation.    Heart:  Regular rate and rhythm. Abdomen:  Soft, nontender and nondistended. Normal bowel sounds, without guarding, and without rebound.   Neurologic:  Alert and  oriented x4;  grossly normal neurologically.  Impression/Plan: Christopher Dennis is here for an endoscopy and colonoscopy to be performed for screening and dysphagia  Risks, benefits, limitations, and alternatives regarding  endoscopy and colonoscopy have been reviewed  with the patient.  Questions have been answered.  All parties agreeable.   Ollen Bowl, MD  05/12/2015, 8:32 AM

## 2015-05-13 ENCOUNTER — Encounter: Payer: Self-pay | Admitting: Gastroenterology

## 2015-05-22 ENCOUNTER — Ambulatory Visit: Payer: Self-pay | Admitting: Physician Assistant

## 2015-05-22 ENCOUNTER — Telehealth: Payer: Self-pay | Admitting: Emergency Medicine

## 2015-05-22 ENCOUNTER — Encounter: Payer: Self-pay | Admitting: Physician Assistant

## 2015-05-22 VITALS — BP 140/100 | HR 104 | Temp 98.5°F

## 2015-05-22 DIAGNOSIS — J018 Other acute sinusitis: Secondary | ICD-10-CM

## 2015-05-22 DIAGNOSIS — E1165 Type 2 diabetes mellitus with hyperglycemia: Secondary | ICD-10-CM

## 2015-05-22 LAB — GLUCOSE, POCT (MANUAL RESULT ENTRY): POC GLUCOSE: 227 mg/dL — AB (ref 70–99)

## 2015-05-22 MED ORDER — AMOXICILLIN 875 MG PO TABS: 875.0000 mg | ORAL_TABLET | Freq: Two times a day (BID) | ORAL | Status: DC

## 2015-05-22 NOTE — Telephone Encounter (Signed)
Tell pt to come in today

## 2015-05-22 NOTE — Progress Notes (Signed)
S: C/o runny nose and congestion for 3 days, no fever, chills, cp/sob, v/d; mucus is green and thick, also glucose has been running high, in 200s was in 300s prior to endoscopy but had not taken meds, ? If need to change meds, hasn't had labs in a while, has a lot of dry mouth that he associates with his cpap machine, also adderall was denied by [employer], ?if we can appeal,    Using otc meds:   O: PE: perrl eomi, normocephalic, tms dull, nasal mucosa red and swollen, throat injected, neck supple no lymph, lungs c t a, cv rrr, neuro intact, fsbs 227  A:  Acute sinusitis, diabetes II with elevated glucose   P: amoxil 875mg  bid x 10d, drink fluids, continue regular meds , use otc meds of choice, return if not improving in 5 days, return earlier if worsening , return on Monday 05/26/2015 for fasting labs

## 2015-05-26 ENCOUNTER — Other Ambulatory Visit: Payer: Self-pay | Admitting: Family Medicine

## 2015-05-26 ENCOUNTER — Other Ambulatory Visit: Payer: Self-pay

## 2015-05-26 DIAGNOSIS — E1165 Type 2 diabetes mellitus with hyperglycemia: Secondary | ICD-10-CM

## 2015-05-27 LAB — CMP12+LP+TP+TSH+6AC+PSA+CBC…
A/G RATIO: 1.7 (ref 1.1–2.5)
ALK PHOS: 72 IU/L (ref 39–117)
ALT: 59 IU/L — ABNORMAL HIGH (ref 0–44)
AST: 63 IU/L — AB (ref 0–40)
Albumin: 4.4 g/dL (ref 3.5–5.5)
BASOS ABS: 0 10*3/uL (ref 0.0–0.2)
BILIRUBIN TOTAL: 0.3 mg/dL (ref 0.0–1.2)
BUN/Creatinine Ratio: 17 (ref 9–20)
BUN: 18 mg/dL (ref 6–24)
Basos: 1 %
CHLORIDE: 102 mmol/L (ref 97–108)
CHOL/HDL RATIO: 5.6 ratio — AB (ref 0.0–5.0)
CREATININE: 1.04 mg/dL (ref 0.76–1.27)
Calcium: 9.6 mg/dL (ref 8.7–10.2)
Cholesterol, Total: 173 mg/dL (ref 100–199)
EOS (ABSOLUTE): 0.2 10*3/uL (ref 0.0–0.4)
EOS: 3 %
Estimated CHD Risk: 1.2 times avg. — ABNORMAL HIGH (ref 0.0–1.0)
Free Thyroxine Index: 2.1 (ref 1.2–4.9)
GFR, EST AFRICAN AMERICAN: 96 mL/min/{1.73_m2} (ref >59)
GFR, EST NON AFRICAN AMERICAN: 83 mL/min/{1.73_m2} (ref >59)
GGT: 46 IU/L (ref 0–65)
GLUCOSE: 129 mg/dL — AB (ref 65–99)
Globulin, Total: 2.6 g/dL (ref 1.5–4.5)
HDL: 31 mg/dL — AB (ref >39)
HEMATOCRIT: 41.3 % (ref 37.5–51.0)
HEMOGLOBIN: 13.8 g/dL (ref 12.6–17.7)
IMMATURE GRANS (ABS): 0 10*3/uL (ref 0.0–0.1)
Immature Granulocytes: 0 %
Iron: 48 ug/dL (ref 38–169)
LDH: 180 IU/L (ref 121–224)
LDL CALC: 107 mg/dL — AB (ref 0–99)
LYMPHS: 44 %
Lymphocytes Absolute: 2.7 10*3/uL (ref 0.7–3.1)
MCH: 30.3 pg (ref 26.6–33.0)
MCHC: 33.4 g/dL (ref 31.5–35.7)
MCV: 91 fL (ref 79–97)
MONOCYTES: 5 %
Monocytes Absolute: 0.3 10*3/uL (ref 0.1–0.9)
Neutrophils Absolute: 2.9 10*3/uL (ref 1.4–7.0)
Neutrophils: 47 %
POTASSIUM: 5 mmol/L (ref 3.5–5.2)
Phosphorus: 4.3 mg/dL (ref 2.5–4.5)
Platelets: 182 10*3/uL (ref 150–379)
Prostate Specific Ag, Serum: 1.6 ng/mL (ref 0.0–4.0)
RBC: 4.55 x10E6/uL (ref 4.14–5.80)
RDW: 13.4 % (ref 12.3–15.4)
Sodium: 140 mmol/L (ref 134–144)
T3 UPTAKE RATIO: 25 % (ref 24–39)
T4, Total: 8.5 ug/dL (ref 4.5–12.0)
TSH: 1.79 u[IU]/mL (ref 0.450–4.500)
Total Protein: 7 g/dL (ref 6.0–8.5)
Triglycerides: 176 mg/dL — ABNORMAL HIGH (ref 0–149)
URIC ACID: 5.8 mg/dL (ref 3.7–8.6)
VLDL CHOLESTEROL CAL: 35 mg/dL (ref 5–40)
WBC: 6.2 10*3/uL (ref 3.4–10.8)

## 2015-05-27 LAB — TESTOSTERONE: Testosterone: 198 ng/dL — ABNORMAL LOW (ref 348–1197)

## 2015-05-27 LAB — PROLACTIN: Prolactin: 8.8 ng/mL (ref 4.0–15.2)

## 2015-05-27 LAB — HGB A1C W/O EAG: HEMOGLOBIN A1C: 10.1 % — AB (ref 4.8–5.6)

## 2015-05-27 LAB — VITAMIN D 25 HYDROXY (VIT D DEFICIENCY, FRACTURES): VIT D 25 HYDROXY: 26.5 ng/mL — AB (ref 30.0–100.0)

## 2015-06-03 ENCOUNTER — Telehealth: Payer: Self-pay

## 2015-06-03 NOTE — Telephone Encounter (Signed)
LVM for pt to return my call.

## 2015-06-03 NOTE — Telephone Encounter (Signed)
-----   Message from Lucilla Lame, MD sent at 06/03/2015  7:35 AM EDT ----- Let the patient know the polyp was hyperplastic and a repeat colonoscopy is needed in 10 years unless the patient is having any problems. ----- Message -----    From: Glennie Isle, CMA    Sent: 05/30/2015  10:58 AM      To: Lucilla Lame, MD  Review path in media

## 2015-06-03 NOTE — Telephone Encounter (Signed)
Pt called back and colonoscopy results were given. Added to recall list.

## 2015-06-17 ENCOUNTER — Encounter: Payer: Self-pay | Admitting: Gastroenterology

## 2015-07-16 ENCOUNTER — Other Ambulatory Visit: Payer: Self-pay | Admitting: Emergency Medicine

## 2015-07-16 DIAGNOSIS — J3089 Other allergic rhinitis: Secondary | ICD-10-CM

## 2015-07-16 MED ORDER — LORATADINE 10 MG PO CAPS: 10.0000 mg | ORAL_CAPSULE | Freq: Every day | ORAL | Status: DC

## 2015-07-16 NOTE — Telephone Encounter (Signed)
Received a faxed medication request from [facility] in [place].  Please advise.  Thank you.

## 2015-07-16 NOTE — Telephone Encounter (Signed)
Med refill approved 

## 2015-07-28 ENCOUNTER — Other Ambulatory Visit: Payer: Self-pay | Admitting: Emergency Medicine

## 2015-07-28 DIAGNOSIS — E559 Vitamin D deficiency, unspecified: Secondary | ICD-10-CM

## 2015-07-28 MED ORDER — VITAMIN D (ERGOCALCIFEROL) 1.25 MG (50000 UNIT) PO CAPS: 50000.0000 [IU] | ORAL_CAPSULE | ORAL | Status: DC

## 2015-07-28 NOTE — Telephone Encounter (Signed)
Med refill approved 

## 2015-07-28 NOTE — Telephone Encounter (Signed)
Received a faxed medication request from [facility].  Please advise.  Thank you.

## 2015-10-01 ENCOUNTER — Other Ambulatory Visit: Payer: Self-pay

## 2015-10-01 DIAGNOSIS — R748 Abnormal levels of other serum enzymes: Secondary | ICD-10-CM

## 2015-10-01 DIAGNOSIS — E291 Testicular hypofunction: Secondary | ICD-10-CM

## 2015-10-01 DIAGNOSIS — E559 Vitamin D deficiency, unspecified: Secondary | ICD-10-CM

## 2015-10-01 NOTE — Progress Notes (Signed)
Here today for fasting labwork only per Dr.Melissa Solum drawn from right hand

## 2015-10-02 LAB — CMP12+LP+TP+TSH+6AC+PSA+CBC…
A/G RATIO: 1.7 (ref 1.1–2.5)
ALBUMIN: 4.6 g/dL (ref 3.5–5.5)
ALK PHOS: 77 IU/L (ref 39–117)
ALT: 56 IU/L — ABNORMAL HIGH (ref 0–44)
AST: 55 IU/L — ABNORMAL HIGH (ref 0–40)
BASOS ABS: 0 10*3/uL (ref 0.0–0.2)
BASOS: 1 %
BILIRUBIN TOTAL: 0.3 mg/dL (ref 0.0–1.2)
BUN/Creatinine Ratio: 16 (ref 9–20)
BUN: 16 mg/dL (ref 6–24)
CALCIUM: 9.7 mg/dL (ref 8.7–10.2)
CHOL/HDL RATIO: 7.8 ratio — AB (ref 0.0–5.0)
CHOLESTEROL TOTAL: 219 mg/dL — AB (ref 100–199)
Chloride: 96 mmol/L (ref 96–106)
Creatinine, Ser: 1 mg/dL (ref 0.76–1.27)
EOS (ABSOLUTE): 0.2 10*3/uL (ref 0.0–0.4)
EOS: 2 %
Estimated CHD Risk: 1.6 times avg. — ABNORMAL HIGH (ref 0.0–1.0)
FREE THYROXINE INDEX: 2.4 (ref 1.2–4.9)
GFR calc Af Amer: 100 mL/min/{1.73_m2} (ref >59)
GFR calc non Af Amer: 87 mL/min/{1.73_m2} (ref >59)
GGT: 49 IU/L (ref 0–65)
GLOBULIN, TOTAL: 2.7 g/dL (ref 1.5–4.5)
Glucose: 212 mg/dL — ABNORMAL HIGH (ref 65–99)
HDL: 28 mg/dL — AB (ref >39)
HEMATOCRIT: 45.6 % (ref 37.5–51.0)
HEMOGLOBIN: 15.3 g/dL (ref 12.6–17.7)
IMMATURE GRANS (ABS): 0 10*3/uL (ref 0.0–0.1)
IMMATURE GRANULOCYTES: 1 %
Iron: 69 ug/dL (ref 38–169)
LDH: 171 IU/L (ref 121–224)
LDL CALC: 138 mg/dL — AB (ref 0–99)
LYMPHS ABS: 2.4 10*3/uL (ref 0.7–3.1)
LYMPHS: 38 %
MCH: 29.4 pg (ref 26.6–33.0)
MCHC: 33.6 g/dL (ref 31.5–35.7)
MCV: 88 fL (ref 79–97)
MONOS ABS: 0.4 10*3/uL (ref 0.1–0.9)
Monocytes: 6 %
NEUTROS PCT: 52 %
Neutrophils Absolute: 3.4 10*3/uL (ref 1.4–7.0)
PLATELETS: 165 10*3/uL (ref 150–379)
PROSTATE SPECIFIC AG, SERUM: 1.8 ng/mL (ref 0.0–4.0)
Phosphorus: 3.7 mg/dL (ref 2.5–4.5)
Potassium: 5.2 mmol/L (ref 3.5–5.2)
RBC: 5.21 x10E6/uL (ref 4.14–5.80)
RDW: 13.7 % (ref 12.3–15.4)
Sodium: 137 mmol/L (ref 134–144)
T3 Uptake Ratio: 27 % (ref 24–39)
T4, Total: 8.8 ug/dL (ref 4.5–12.0)
TRIGLYCERIDES: 264 mg/dL — AB (ref 0–149)
TSH: 1.73 u[IU]/mL (ref 0.450–4.500)
Total Protein: 7.3 g/dL (ref 6.0–8.5)
Uric Acid: 5.2 mg/dL (ref 3.7–8.6)
VLDL CHOLESTEROL CAL: 53 mg/dL — AB (ref 5–40)
WBC: 6.3 10*3/uL (ref 3.4–10.8)

## 2015-10-02 LAB — MICROALBUMIN / CREATININE URINE RATIO: Creatinine, Urine: 38.8 mg/dL

## 2015-10-02 LAB — PROLACTIN: PROLACTIN: 7.7 ng/mL (ref 4.0–15.2)

## 2015-10-02 LAB — FSH/LH
FSH: 7.9 m[IU]/mL (ref 1.5–12.4)
LH: 4.4 m[IU]/mL (ref 1.7–8.6)

## 2015-10-02 LAB — SEX HORMONE BINDING GLOBULIN: Sex Hormone Binding: 22.1 nmol/L (ref 19.3–76.4)

## 2015-10-02 LAB — VITAMIN D 25 HYDROXY (VIT D DEFICIENCY, FRACTURES): Vit D, 25-Hydroxy: 27 ng/mL — ABNORMAL LOW (ref 30.0–100.0)

## 2015-10-02 LAB — TESTOSTERONE: TESTOSTERONE: 219 ng/dL — AB (ref 348–1197)

## 2015-10-02 LAB — HGB A1C W/O EAG: Hgb A1c MFr Bld: 10.4 % — ABNORMAL HIGH (ref 4.8–5.6)

## 2015-10-03 NOTE — Progress Notes (Signed)
Lab results was sent to Dr. Gabriel Carina at [facility] per patient's request.

## 2015-10-23 ENCOUNTER — Ambulatory Visit: Payer: Self-pay | Admitting: Family

## 2015-10-23 DIAGNOSIS — R05 Cough: Secondary | ICD-10-CM

## 2015-10-23 DIAGNOSIS — J40 Bronchitis, not specified as acute or chronic: Secondary | ICD-10-CM

## 2015-10-23 DIAGNOSIS — Z299 Encounter for prophylactic measures, unspecified: Secondary | ICD-10-CM

## 2015-10-23 DIAGNOSIS — M25511 Pain in right shoulder: Secondary | ICD-10-CM

## 2015-10-23 DIAGNOSIS — R059 Cough, unspecified: Secondary | ICD-10-CM

## 2015-10-24 LAB — HEPATITIS PANEL, ACUTE
HEP B C IGM: NEGATIVE
Hep A IgM: NEGATIVE
Hep C Virus Ab: <0.1 s/co ratio (ref 0.0–0.9)
Hepatitis B Surface Ag: NEGATIVE

## 2015-10-24 NOTE — Progress Notes (Signed)
Lab results were faxed to Dr. Gabriel Carina per patient's request.

## 2015-10-24 NOTE — Progress Notes (Signed)
See note in paper chart by Heather Ratcliffe, PAC  

## 2015-11-24 ENCOUNTER — Other Ambulatory Visit: Payer: Self-pay | Admitting: Emergency Medicine

## 2015-11-24 MED ORDER — METFORMIN HCL 1000 MG PO TABS: 1000.0000 mg | ORAL_TABLET | Freq: Two times a day (BID) | ORAL | Status: DC

## 2015-11-24 NOTE — Telephone Encounter (Signed)
Received a faxed medication request from [facility] in [place].  Please advise.  Thank you.

## 2015-12-22 ENCOUNTER — Other Ambulatory Visit: Payer: Self-pay | Admitting: Emergency Medicine

## 2015-12-22 NOTE — Telephone Encounter (Signed)
Received a faxed medication request from [facility] in [place].  Please advise.  Thank you.

## 2015-12-23 MED ORDER — LISINOPRIL 20 MG PO TABS
20.0000 mg | ORAL_TABLET | ORAL | Status: DC
Start: 2015-12-23 — End: 2016-06-30

## 2015-12-23 NOTE — Telephone Encounter (Signed)
Med refill approved 

## 2016-02-04 ENCOUNTER — Other Ambulatory Visit: Payer: Self-pay

## 2016-02-04 DIAGNOSIS — Z299 Encounter for prophylactic measures, unspecified: Secondary | ICD-10-CM

## 2016-02-04 NOTE — Progress Notes (Signed)
Patient came in to have blood drawn per dr. Gabriel Carina at [facility] orders.  Blood was drawn from the right arm without any incident.  Patient wants results sent to Dr. Gabriel Carina when they are finalized.

## 2016-02-05 LAB — CMP12+LP+TP+TSH+6AC+CBC/D/PLT
ALK PHOS: 58 IU/L (ref 39–117)
ALT: 27 IU/L (ref 0–44)
AST: 31 IU/L (ref 0–40)
Albumin/Globulin Ratio: 1.9 (ref 1.2–2.2)
Albumin: 4.6 g/dL (ref 3.5–5.5)
BASOS ABS: 0 10*3/uL (ref 0.0–0.2)
BILIRUBIN TOTAL: 0.3 mg/dL (ref 0.0–1.2)
BUN / CREAT RATIO: 19 (ref 9–20)
BUN: 19 mg/dL (ref 6–24)
Basos: 0 %
CALCIUM: 9.7 mg/dL (ref 8.7–10.2)
CHOL/HDL RATIO: 3.9 ratio (ref 0.0–5.0)
CREATININE: 1 mg/dL (ref 0.76–1.27)
Chloride: 101 mmol/L (ref 96–106)
Cholesterol, Total: 132 mg/dL (ref 100–199)
EOS (ABSOLUTE): 0.1 10*3/uL (ref 0.0–0.4)
Eos: 2 %
Estimated CHD Risk: 0.7 times avg. (ref 0.0–1.0)
FREE THYROXINE INDEX: 1.8 (ref 1.2–4.9)
GFR, EST AFRICAN AMERICAN: 100 mL/min/{1.73_m2} (ref >59)
GFR, EST NON AFRICAN AMERICAN: 86 mL/min/{1.73_m2} (ref >59)
GGT: 34 IU/L (ref 0–65)
GLOBULIN, TOTAL: 2.4 g/dL (ref 1.5–4.5)
Glucose: 148 mg/dL — ABNORMAL HIGH (ref 65–99)
HDL: 34 mg/dL — AB (ref >39)
Hematocrit: 44.5 % (ref 37.5–51.0)
Hemoglobin: 15.1 g/dL (ref 12.6–17.7)
Immature Grans (Abs): 0 10*3/uL (ref 0.0–0.1)
Immature Granulocytes: 0 %
Iron: 60 ug/dL (ref 38–169)
LDH: 143 IU/L (ref 121–224)
LDL CALC: 62 mg/dL (ref 0–99)
LYMPHS ABS: 2.4 10*3/uL (ref 0.7–3.1)
Lymphs: 34 %
MCH: 30.6 pg (ref 26.6–33.0)
MCHC: 33.9 g/dL (ref 31.5–35.7)
MCV: 90 fL (ref 79–97)
MONOS ABS: 0.3 10*3/uL (ref 0.1–0.9)
Monocytes: 5 %
NEUTROS PCT: 59 %
Neutrophils Absolute: 4 10*3/uL (ref 1.4–7.0)
PLATELETS: 177 10*3/uL (ref 150–379)
Phosphorus: 3.2 mg/dL (ref 2.5–4.5)
Potassium: 4.9 mmol/L (ref 3.5–5.2)
RBC: 4.94 x10E6/uL (ref 4.14–5.80)
RDW: 13.8 % (ref 12.3–15.4)
Sodium: 137 mmol/L (ref 134–144)
T3 UPTAKE RATIO: 25 % (ref 24–39)
T4 TOTAL: 7 ug/dL (ref 4.5–12.0)
TRIGLYCERIDES: 178 mg/dL — AB (ref 0–149)
TSH: 2.07 u[IU]/mL (ref 0.450–4.500)
Total Protein: 7 g/dL (ref 6.0–8.5)
Uric Acid: 4.1 mg/dL (ref 3.7–8.6)
VLDL CHOLESTEROL CAL: 36 mg/dL (ref 5–40)
WBC: 6.9 10*3/uL (ref 3.4–10.8)

## 2016-02-05 LAB — VITAMIN D 25 HYDROXY (VIT D DEFICIENCY, FRACTURES): VIT D 25 HYDROXY: 40.3 ng/mL (ref 30.0–100.0)

## 2016-02-05 LAB — HGB A1C W/O EAG: HEMOGLOBIN A1C: 7.5 % — AB (ref 4.8–5.6)

## 2016-02-05 LAB — FSH/LH
FSH: 8.2 m[IU]/mL (ref 1.5–12.4)
LH: 4.8 m[IU]/mL (ref 1.7–8.6)

## 2016-02-05 LAB — TESTOSTERONE: Testosterone: 244 ng/dL — ABNORMAL LOW (ref 348–1197)

## 2016-02-06 ENCOUNTER — Other Ambulatory Visit: Payer: Self-pay | Admitting: Physician Assistant

## 2016-02-06 NOTE — Telephone Encounter (Signed)
Med refill approved 

## 2016-03-08 ENCOUNTER — Other Ambulatory Visit: Payer: Self-pay | Admitting: Physician Assistant

## 2016-03-08 ENCOUNTER — Other Ambulatory Visit: Payer: Self-pay | Admitting: Emergency Medicine

## 2016-03-08 DIAGNOSIS — R7989 Other specified abnormal findings of blood chemistry: Secondary | ICD-10-CM

## 2016-03-08 MED ORDER — VITAMIN D (ERGOCALCIFEROL) 1.25 MG (50000 UNIT) PO CAPS: 50000.0000 [IU] | ORAL_CAPSULE | ORAL | Status: DC

## 2016-03-23 ENCOUNTER — Encounter: Payer: Self-pay | Admitting: Emergency Medicine

## 2016-03-23 ENCOUNTER — Emergency Department
Admission: EM | Admit: 2016-03-23 | Discharge: 2016-03-23 | Disposition: A | Discharge status: Home / Self Care | Payer: Managed Care, Other (non HMO) | Attending: Emergency Medicine | Admitting: Emergency Medicine

## 2016-03-23 DIAGNOSIS — E119 Type 2 diabetes mellitus without complications: Secondary | ICD-10-CM | POA: Insufficient documentation

## 2016-03-23 DIAGNOSIS — I2510 Atherosclerotic heart disease of native coronary artery without angina pectoris: Secondary | ICD-10-CM | POA: Insufficient documentation

## 2016-03-23 DIAGNOSIS — L03115 Cellulitis of right lower limb: Secondary | ICD-10-CM | POA: Diagnosis not present

## 2016-03-23 DIAGNOSIS — F17290 Nicotine dependence, other tobacco product, uncomplicated: Secondary | ICD-10-CM | POA: Diagnosis not present

## 2016-03-23 DIAGNOSIS — Z79899 Other long term (current) drug therapy: Secondary | ICD-10-CM | POA: Diagnosis not present

## 2016-03-23 DIAGNOSIS — Z7984 Long term (current) use of oral hypoglycemic drugs: Secondary | ICD-10-CM | POA: Diagnosis not present

## 2016-03-23 MED ORDER — CEPHALEXIN 500 MG PO CAPS
500.0000 mg | ORAL_CAPSULE | Freq: Once | ORAL | Status: AC
Start: 2016-03-23 — End: 2016-03-23
  Administered 2016-03-23: 500 mg via ORAL
  Filled 2016-03-23: qty 1

## 2016-03-23 MED ORDER — BACITRACIN ZINC 500 UNIT/GM EX OINT
TOPICAL_OINTMENT | Freq: Once | CUTANEOUS | Status: AC
  Administered 2016-03-23: 1 via TOPICAL
  Filled 2016-03-23: qty 0.9

## 2016-03-23 MED ORDER — BACITRACIN ZINC 500 UNIT/GM EX OINT: TOPICAL_OINTMENT | CUTANEOUS | 0 refills | Status: AC

## 2016-03-23 MED ORDER — CEPHALEXIN 500 MG PO CAPS
500.0000 mg | ORAL_CAPSULE | Freq: Three times a day (TID) | ORAL | 0 refills | Status: DC
Start: 2016-03-23 — End: 2016-03-31

## 2016-03-23 NOTE — ED Provider Notes (Signed)
[facility] Emergency Department Provider Note ____________________________________________  Time seen: 2013  I have reviewed the triage vital signs and the nursing notes.  HISTORY  Chief Complaint  Abrasion  HPI Christopher Dennis is a 50 y.o. male with a history of diabetes presents to the ED for evaluation of a lower extremity wound on the right shin. He describesscraping his shin on a trailer on Sunday, his prior to arrival. In the interim he's been cleaning the wound daily with hydrogen peroxide and alcohol, and applying cortisone cream to the area. He reports increased redness to the area but denies any interim fevers, chills, sweats. He also denies any draining, or weeping from the wound. He notes some tenderness and swelling locally around the wound but denies any other symptoms at this time.  Past Medical History:  Diagnosis Date  . Abnormal LFTs 12/04/2012  . Arteriosclerosis of coronary artery 12/04/2012  . Back ache 12/04/2012  . BP (high blood pressure) 12/04/2012  . Clinical depression 12/04/2012  . DDD (degenerative disc disease), lumbosacral 12/11/2012  . Diabetes ([place])   . Displacement of lumbar intervertebral disc without myelopathy 12/11/2012  . HLD (hyperlipidemia) 12/04/2012  . Lumbar canal stenosis 12/11/2012  . Neuropathy ([place]) 12/04/2012  . Sleep apnea    CPAP - hasn't been using, no sure if it works  . 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 Trouble swallowing   . Gastritis   . Stricture and stenosis of esophagus   . Special screening for malignant neoplasms, colon   . Benign neoplasm of sigmoid colon   . Fourth degree hemorrhoids   . DDD (degenerative disc disease), lumbosacral 12/11/2012  . Displacement of lumbar intervertebral disc without myelopathy 12/11/2012  . 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 Lumbar canal stenosis 12/11/2012  . Back ache 12/04/2012   . Arteriosclerosis of coronary artery 12/04/2012  . Clinical depression 12/04/2012  . Diabetes ([place]) 12/04/2012  . Abnormal LFTs 12/04/2012  . BP (high blood pressure) 12/04/2012  . HLD (hyperlipidemia) 12/04/2012  . Below normal amount of sodium in the blood 12/04/2012  . Abnormal neurological finding suggestive of lumbar-level spinal disorder 12/04/2012  . Neuropathy ([place]) 12/04/2012    Past Surgical History:  Procedure Laterality Date  . BACK SURGERY  2000  . COLONOSCOPY WITH PROPOFOL N/A 05/12/2015   Procedure: COLONOSCOPY WITH PROPOFOL;  Surgeon: Lucilla Lame, MD;  Location: [facility];  Service: Endoscopy;  Laterality: N/A;  . CORONARY ANGIOPLASTY WITH STENT PLACEMENT  06/17/2011  . ESOPHAGOGASTRODUODENOSCOPY (EGD) WITH PROPOFOL N/A 05/12/2015   Procedure: ESOPHAGOGASTRODUODENOSCOPY (EGD) WITH PROPOFOL;  Surgeon: Lucilla Lame, MD;  Location: [facility];  Service: Endoscopy;  Laterality: N/A;  CPAP  . KNEE ARTHROSCOPY Right 09/19/2014  . Laminectomy posterior cervicle decomp    . laminectomy posterior lumbar facetectomy & Foraminotomy w/decomp  12/15/2012  . meniscus tear  2006  . partial medial and extensive lateral meniscectomy    . POLYPECTOMY  05/12/2015   Procedure: POLYPECTOMY;  Surgeon: Lucilla Lame, MD;  Location: [facility];  Service: Endoscopy;;    Current Outpatient Rx  . Order #: [id number] Class: Print  . Order #: [id number] Class: Print  . Order #: [id number] Class: Historical Med  . Order #: [id number] Class: Normal  . Order #: [id number] Class: Normal  . Order #: [id number] Class: Normal  . Order #: [id number] Class: Normal  . Order #: [id number] Class: Historical Med  . Order #: [id number] Class: Historical Med  .  Order #: [id number] Class: Normal    Allergies Review of patient's allergies indicates no known allergies.  Family History  Problem Relation Age of Onset  . Diabetes Mother   . COPD Mother   . Alcohol abuse Father   . Asthma Sister      Social History Social History  Substance Use Topics  . Smoking status: Former Smoker    Quit date: 01/21/2009  . Smokeless tobacco: Current User    Types: Snuff  . Alcohol use 0.0 oz/week     Comment: rarely - 1 beer/month   Review of Systems  Constitutional: Negative for fever. Musculoskeletal: Negative for back pain. Skin: Negative for rash.Right lower extremity abrasion with infection as above. Neurological: Negative for headaches, focal weakness or numbness. ____________________________________________  PHYSICAL EXAM:  VITAL SIGNS: ED Triage Vitals  Enc Vitals Group     BP 03/23/16 1950 111/76     Pulse Rate 03/23/16 1950 80     Resp 03/23/16 1950 18     Temp 03/23/16 1950 97.7 F (36.5 C)     Temp src --      SpO2 03/23/16 1950 100 %     Weight 03/23/16 1950 235 lb (106.6 kg)     Height 03/23/16 1950 6' (1.829 m)     Head Circumference --      Peak Flow --      Pain Score 03/23/16 1948 5     Pain Loc --      Pain Edu? --      Excl. in [place]? --    Constitutional: Alert and oriented. Well appearing and in no distress. Head: Normocephalic and atraumatic. Musculoskeletal: Nontender with normal range of motion in all extremities.  Neurologic:  Normal gait without ataxia. Normal speech and language. No gross focal neurologic deficits are appreciated. Skin:  Skin is warm, dry and intact. No rash noted.Patient is noted to have a 4 cm linear abrasion to the anterior middle portion of the right shin. There is local erythema noted. The wound bed itself is dry with local eschar noted. There is no surrounding induration, lymphangitis, or streaking. ____________________________________________  PROCEDURES  Keflex 500 mg PO Wound dressing w/ bacitracin ointment  ____________________________________________  INITIAL IMPRESSION / ASSESSMENT AND PLAN / ED COURSE  Patient with an anterior right lower extremity cellulitis secondary to a deep skin abrasion. He is discharged  with a prescription for Keflex as well as bacitracin ointment. He is advised on wound care management including wound daily dressing changes. He is advised to avoid use of Neosporin, cortisone, and hydrogen peroxide for wound care going forward. He will follow-up with his primary care provider or return to the local urgent care for wound check is necessary.  Clinical Course   @EDHMEDS @   ____________________________________________  FINAL CLINICAL IMPRESSION(S) / ED DIAGNOSES  Final diagnoses:  Cellulitis of right lower extremity     Melvenia Needles, PA-C 03/23/16 2103    Delman Kitten, MD 03/24/16 [phone]

## 2016-03-23 NOTE — ED Triage Notes (Addendum)
Patient ambulatory to triage with steady gait, without difficulty or distress noted; pt reports scraped right shin on a trailer on Sunday; area with slight redness around area; using alcohol, hydrogen peroxide & cortisone to site; took 600mg  ibuprofen PTA

## 2016-03-23 NOTE — Discharge Instructions (Signed)
Keep the wound clean, dry, and covered. Rest with the leg elevated to promote healing. Follow-up with your provider or return to the ED for worsening symptoms or signs of spreading infection.

## 2016-03-23 NOTE — ED Notes (Signed)
pt  Presents to ED with scraped  To right shin on a trailer on Sunday. Noted the area with slight redness around area and yellow eschar in wound bed. Pt states using alcohol, hydrogen peroxide & cortisone to site; took 600 mg ibuprofen before coming not improving getting worse. Pt also states he is a diabetic as well.

## 2016-03-29 ENCOUNTER — Other Ambulatory Visit: Payer: Self-pay | Admitting: Physician Assistant

## 2016-03-31 ENCOUNTER — Ambulatory Visit: Payer: Self-pay | Admitting: Physician Assistant

## 2016-03-31 ENCOUNTER — Encounter: Payer: Self-pay | Admitting: Physician Assistant

## 2016-03-31 VITALS — BP 110/70 | HR 92 | Temp 97.9°F

## 2016-03-31 DIAGNOSIS — F909 Attention-deficit hyperactivity disorder, unspecified type: Secondary | ICD-10-CM

## 2016-03-31 DIAGNOSIS — L03115 Cellulitis of right lower limb: Secondary | ICD-10-CM

## 2016-03-31 MED ORDER — SULFAMETHOXAZOLE-TRIMETHOPRIM 800-160 MG PO TABS: 1.0000 | ORAL_TABLET | Freq: Two times a day (BID) | ORAL | 0 refills | Status: DC

## 2016-03-31 MED ORDER — ATOMOXETINE HCL 100 MG PO CAPS: 100.0000 mg | ORAL_CAPSULE | Freq: Every day | ORAL | 6 refills | Status: DC

## 2016-03-31 MED ORDER — MUPIROCIN 2 % EX OINT: TOPICAL_OINTMENT | CUTANEOUS | 0 refills | Status: DC

## 2016-03-31 MED ORDER — ATOMOXETINE HCL 40 MG PO CAPS: ORAL_CAPSULE | ORAL | 0 refills | Status: DC

## 2016-03-31 NOTE — Progress Notes (Signed)
S: states the wound on his leg has not healed, went to the ER for large abrasion on lower leg, was given keflex, area is still red and warm, painful to touch, no pus or drainage per patient, no fever/chills, also ? If could get rx for strattera as the insurance company wouldn't approve concerta bc his dx of add was not before 50 y/o, pt has hx of documented adult add  O: vitals wnl, nad, skin on lower leg still red warm raised at abrasion, tender to palp, n/v intact,   A: cellulitis of wound, adult add  P: septra ds; trial of strattera, use 40mg  for 1 week then increase to 80mg , when done with that rx start rx for 100mg 

## 2016-05-03 ENCOUNTER — Other Ambulatory Visit: Payer: Self-pay | Admitting: Physician Assistant

## 2016-06-02 ENCOUNTER — Ambulatory Visit: Payer: Self-pay | Admitting: Physician Assistant

## 2016-06-02 ENCOUNTER — Encounter: Payer: Self-pay | Admitting: Physician Assistant

## 2016-06-02 VITALS — BP 120/79 | HR 99 | Temp 98.0°F

## 2016-06-02 DIAGNOSIS — R0982 Postnasal drip: Secondary | ICD-10-CM

## 2016-06-02 DIAGNOSIS — Z299 Encounter for prophylactic measures, unspecified: Secondary | ICD-10-CM

## 2016-06-02 NOTE — Progress Notes (Signed)
S: c/o runny nose, congestion ,sore throat, some sinus pressure, sx for about a week, denies fever/chills/body aches, cough, cp/sob, or v/d  O: vitals wnl, nad, perrl eomi, conjunctiva wnl, tms dull, nasal mucosa swollen and boggy, throat wnl, neck supple no lymph, lungs c t a, cv rrr  A: acute seasonal allergies  P: saline nasal rinse, claritin, flonase

## 2016-06-03 LAB — CMP12+LP+TP+TSH+6AC+CBC/D/PLT
A/G RATIO: 2 (ref 1.2–2.2)
ALK PHOS: 70 IU/L (ref 39–117)
ALT: 24 IU/L (ref 0–44)
AST: 28 IU/L (ref 0–40)
Albumin: 4.8 g/dL (ref 3.5–5.5)
BASOS ABS: 0 10*3/uL (ref 0.0–0.2)
BASOS: 0 %
BILIRUBIN TOTAL: 0.5 mg/dL (ref 0.0–1.2)
BUN / CREAT RATIO: 18 (ref 9–20)
BUN: 18 mg/dL (ref 6–24)
CHLORIDE: 100 mmol/L (ref 96–106)
CHOLESTEROL TOTAL: 141 mg/dL (ref 100–199)
Calcium: 9.5 mg/dL (ref 8.7–10.2)
Chol/HDL Ratio: 3.7 ratio units (ref 0.0–5.0)
Creatinine, Ser: 1.02 mg/dL (ref 0.76–1.27)
EOS (ABSOLUTE): 0.2 10*3/uL (ref 0.0–0.4)
EOS: 2 %
ESTIMATED CHD RISK: 0.6 times avg. (ref 0.0–1.0)
FREE THYROXINE INDEX: 1.9 (ref 1.2–4.9)
GFR calc non Af Amer: 84 mL/min/{1.73_m2} (ref >59)
GFR, EST AFRICAN AMERICAN: 97 mL/min/{1.73_m2} (ref >59)
GGT: 43 IU/L (ref 0–65)
GLUCOSE: 172 mg/dL — AB (ref 65–99)
Globulin, Total: 2.4 g/dL (ref 1.5–4.5)
HDL: 38 mg/dL — AB (ref >39)
HEMATOCRIT: 44.8 % (ref 37.5–51.0)
HEMOGLOBIN: 15.4 g/dL (ref 12.6–17.7)
IMMATURE GRANS (ABS): 0 10*3/uL (ref 0.0–0.1)
IMMATURE GRANULOCYTES: 0 %
Iron: 53 ug/dL (ref 38–169)
LDH: 161 IU/L (ref 121–224)
LDL CALC: 73 mg/dL (ref 0–99)
LYMPHS ABS: 2 10*3/uL (ref 0.7–3.1)
Lymphs: 20 %
MCH: 30.7 pg (ref 26.6–33.0)
MCHC: 34.4 g/dL (ref 31.5–35.7)
MCV: 89 fL (ref 79–97)
MONOCYTES: 6 %
Monocytes Absolute: 0.6 10*3/uL (ref 0.1–0.9)
NEUTROS PCT: 72 %
Neutrophils Absolute: 7.1 10*3/uL — ABNORMAL HIGH (ref 1.4–7.0)
PLATELETS: 176 10*3/uL (ref 150–379)
Phosphorus: 3.1 mg/dL (ref 2.5–4.5)
Potassium: 5.1 mmol/L (ref 3.5–5.2)
RBC: 5.01 x10E6/uL (ref 4.14–5.80)
RDW: 13.6 % (ref 12.3–15.4)
SODIUM: 137 mmol/L (ref 134–144)
T3 Uptake Ratio: 25 % (ref 24–39)
T4, Total: 7.7 ug/dL (ref 4.5–12.0)
TSH: 1.58 u[IU]/mL (ref 0.450–4.500)
Total Protein: 7.2 g/dL (ref 6.0–8.5)
Triglycerides: 148 mg/dL (ref 0–149)
URIC ACID: 4.6 mg/dL (ref 3.7–8.6)
VLDL CHOLESTEROL CAL: 30 mg/dL (ref 5–40)
WBC: 9.9 10*3/uL (ref 3.4–10.8)

## 2016-06-03 LAB — HGB A1C W/O EAG: Hgb A1c MFr Bld: 7.4 % — ABNORMAL HIGH (ref 4.8–5.6)

## 2016-06-30 ENCOUNTER — Other Ambulatory Visit: Payer: Self-pay | Admitting: Physician Assistant

## 2016-09-02 ENCOUNTER — Other Ambulatory Visit: Payer: Self-pay | Admitting: Emergency Medicine

## 2016-09-02 DIAGNOSIS — R7989 Other specified abnormal findings of blood chemistry: Secondary | ICD-10-CM

## 2016-09-02 MED ORDER — VITAMIN D (ERGOCALCIFEROL) 1.25 MG (50000 UNIT) PO CAPS: 50000.0000 [IU] | ORAL_CAPSULE | ORAL | 0 refills | Status: DC

## 2016-09-02 NOTE — Telephone Encounter (Signed)
Med refill for vit d approved 

## 2016-09-18 ENCOUNTER — Other Ambulatory Visit: Payer: Self-pay | Admitting: Physician Assistant

## 2016-09-18 DIAGNOSIS — J3089 Other allergic rhinitis: Secondary | ICD-10-CM

## 2016-09-20 NOTE — Telephone Encounter (Signed)
Med refill on claritin approved

## 2016-09-30 ENCOUNTER — Encounter: Payer: Self-pay | Admitting: Physician Assistant

## 2016-09-30 ENCOUNTER — Ambulatory Visit: Payer: Self-pay | Admitting: Physician Assistant

## 2016-09-30 VITALS — BP 130/80 | HR 94 | Temp 97.9°F

## 2016-09-30 DIAGNOSIS — K219 Gastro-esophageal reflux disease without esophagitis: Secondary | ICD-10-CM

## 2016-09-30 MED ORDER — RANITIDINE HCL 150 MG PO TABS: 150.0000 mg | ORAL_TABLET | Freq: Two times a day (BID) | ORAL | 0 refills | Status: DC

## 2016-09-30 NOTE — Addendum Note (Signed)
Addended by: Rudene Anda T on: 09/30/2016 03:01 PM   Modules accepted: Orders

## 2016-09-30 NOTE — Progress Notes (Signed)
   Subjective: Gastric Reflux    Patient ID: Christopher Dennis, male    DOB: Dec 25, 1965, 51 y.o.   MRN: [id number]  HPI Patient c/o 2-3 months of gastric reflux. States problem worse at night. Described "burning" sensation" upper gastric area. Patient is taking Plavix.   Review of Systems    Hyperlipidema, Hypertension, and Diabetes. Objective:   Physical Exam HEENT unremarkable. Neck supple w/o bruits or adenopathy. Lungs CTA and Heart RRR.       Assessment & Plan:GERD  Zantac 150 mg BID. Due to taking Plavix advised to follow up with Family Doctor in 1-2 weeks.

## 2016-10-01 LAB — COMPREHENSIVE METABOLIC PANEL
A/G RATIO: 1.7 (ref 1.2–2.2)
ALK PHOS: 67 IU/L (ref 39–117)
ALT: 40 IU/L (ref 0–44)
AST: 39 IU/L (ref 0–40)
Albumin: 4.7 g/dL (ref 3.5–5.5)
BUN/Creatinine Ratio: 18 (ref 9–20)
BUN: 19 mg/dL (ref 6–24)
Bilirubin Total: 0.4 mg/dL (ref 0.0–1.2)
CHLORIDE: 99 mmol/L (ref 96–106)
CO2: 21 mmol/L (ref 18–29)
Calcium: 10.4 mg/dL — ABNORMAL HIGH (ref 8.7–10.2)
Creatinine, Ser: 1.04 mg/dL (ref 0.76–1.27)
GFR calc Af Amer: 95 mL/min/{1.73_m2} (ref >59)
GFR calc non Af Amer: 82 mL/min/{1.73_m2} (ref >59)
GLOBULIN, TOTAL: 2.8 g/dL (ref 1.5–4.5)
Glucose: 172 mg/dL — ABNORMAL HIGH (ref 65–99)
POTASSIUM: 5.5 mmol/L — AB (ref 3.5–5.2)
SODIUM: 136 mmol/L (ref 134–144)
Total Protein: 7.5 g/dL (ref 6.0–8.5)

## 2016-10-01 LAB — VITAMIN D 25 HYDROXY (VIT D DEFICIENCY, FRACTURES): Vit D, 25-Hydroxy: 35.8 ng/mL (ref 30.0–100.0)

## 2016-10-01 LAB — MICROALBUMIN / CREATININE URINE RATIO
CREATININE, UR: 69.5 mg/dL
MICROALB/CREAT RATIO: 8.1 mg/g{creat} (ref 0.0–30.0)
MICROALBUM., U, RANDOM: 5.6 ug/mL

## 2016-10-01 LAB — HGB A1C W/O EAG: HEMOGLOBIN A1C: 8.9 % — AB (ref 4.8–5.6)

## 2016-10-05 ENCOUNTER — Other Ambulatory Visit: Payer: Self-pay | Admitting: Physician Assistant

## 2016-10-05 MED ORDER — ONETOUCH ULTRA BLUE VI STRP: ORAL_STRIP | 12 refills | Status: DC

## 2016-10-05 NOTE — Telephone Encounter (Signed)
Refill for one touch test strips x 1 year

## 2016-10-05 NOTE — Telephone Encounter (Signed)
Med refill for metformin approved,

## 2016-10-15 ENCOUNTER — Encounter: Payer: Self-pay | Admitting: Physician Assistant

## 2016-10-15 ENCOUNTER — Ambulatory Visit: Payer: Self-pay | Admitting: Physician Assistant

## 2016-10-15 VITALS — BP 110/70 | HR 101 | Temp 97.6°F | Ht 72.0 in | Wt 247.0 lb

## 2016-10-15 DIAGNOSIS — Z0000 Encounter for general adult medical examination without abnormal findings: Secondary | ICD-10-CM

## 2016-10-15 NOTE — Progress Notes (Signed)
   Subjective: Physical Exam    Patient ID: Christopher Dennis, male    DOB: 08/11/1966, 51 y.o.   MRN: [id number]  HPI Patient report for physical exam.   Review of Systems DM, Hpertension, and Hyperlipidema.    Objective:   Physical Exam HEENT unremarkable. Neck supple with adenopathy or bruits. Lungs CTA, and Heart RRR. [place] w/o HSN, Normoactive BS, and SNTP. No cervical or lumbar deformity with F/E ROM. No extremities deformity. F/E ROM, Strength 5/5. CNII-XII grossly intact.       Assessment & Plan:Well Exam.  Follow up for lab results.

## 2016-11-16 ENCOUNTER — Ambulatory Visit: Payer: Self-pay | Admitting: Physician Assistant

## 2016-11-16 ENCOUNTER — Encounter: Payer: Self-pay | Admitting: Physician Assistant

## 2016-11-16 VITALS — BP 110/89 | HR 99 | Temp 98.7°F

## 2016-11-16 DIAGNOSIS — R040 Epistaxis: Secondary | ICD-10-CM

## 2016-11-16 DIAGNOSIS — J3089 Other allergic rhinitis: Secondary | ICD-10-CM

## 2016-11-16 MED ORDER — LORATADINE 10 MG PO TABS: 10.0000 mg | ORAL_TABLET | Freq: Every day | ORAL | 3 refills | Status: DC

## 2016-11-16 NOTE — Progress Notes (Signed)
S: c/o nose bleed on r side last few nights, is on plavix, no known injury, the left side of his nose is  runny, congested; , denies fever/chills/body aches, cough, cp/sob, or v/d  O: vitals wnl, nad, perrl eomi, conjunctiva wnl, tms dull, nasal mucosa on r wnl, some dried blood anteriorly, no active bleeding, left side is swollen and boggy, throat wnl, neck supple no lymph, lungs c t a, cv rrr  A: , anterior nose bleed, nonactive; acute seasonal allergies  P: saline nasal rinse, vaseline or neosporin to anterior nares, refill on claritn for allergies, if nose bleeds not improved by thurs or Friday will refer patient to eNT

## 2016-11-26 ENCOUNTER — Other Ambulatory Visit: Payer: Self-pay | Admitting: Physician Assistant

## 2016-11-26 DIAGNOSIS — R7989 Other specified abnormal findings of blood chemistry: Secondary | ICD-10-CM

## 2017-02-07 ENCOUNTER — Other Ambulatory Visit: Payer: Self-pay | Admitting: Physician Assistant

## 2017-02-07 NOTE — Telephone Encounter (Signed)
Med refill for lisinopril approved

## 2017-02-16 ENCOUNTER — Ambulatory Visit: Payer: Self-pay | Admitting: Physician Assistant

## 2017-02-16 ENCOUNTER — Encounter: Payer: Self-pay | Admitting: Physician Assistant

## 2017-02-16 VITALS — BP 140/80 | HR 93 | Temp 98.5°F | Resp 16

## 2017-02-16 DIAGNOSIS — J069 Acute upper respiratory infection, unspecified: Secondary | ICD-10-CM

## 2017-02-16 MED ORDER — AMOXICILLIN 875 MG PO TABS: 875.0000 mg | ORAL_TABLET | Freq: Two times a day (BID) | ORAL | 0 refills | Status: DC

## 2017-02-16 NOTE — Progress Notes (Signed)
S: C/o runny nose and congestion for 5 days, no fever, chills, cp/sob, v/d; mucus is yellow/green, is really tired, starting get sick [place] the [place] Using otc meds: robitussin  O: PE: viatls wnl, nad perrl eomi, normocephalic, tms dull, nasal mucosa red and swollen, throat injected, neck supple no lymph, lungs c t a, cv rrr, neuro intact  A:  Acute uri   P: drink fluids, continue regular meds , use otc meds of choice, return if not improving in 5 days, return earlier if worsening , amoxil 875 mg bid

## 2017-02-21 ENCOUNTER — Other Ambulatory Visit: Payer: Self-pay

## 2017-02-21 DIAGNOSIS — Z299 Encounter for prophylactic measures, unspecified: Secondary | ICD-10-CM

## 2017-02-21 NOTE — Progress Notes (Signed)
Patient came in to have blood drawn for testing per dr. Joycie Peek orders.

## 2017-02-22 LAB — COMPREHENSIVE METABOLIC PANEL
ALBUMIN: 4.3 g/dL (ref 3.5–5.5)
ALK PHOS: 58 IU/L (ref 39–117)
ALT: 39 IU/L (ref 0–44)
AST: 47 IU/L — AB (ref 0–40)
Albumin/Globulin Ratio: 1.7 (ref 1.2–2.2)
BUN / CREAT RATIO: 21 — AB (ref 9–20)
BUN: 20 mg/dL (ref 6–24)
Bilirubin Total: 0.2 mg/dL (ref 0.0–1.2)
CO2: 18 mmol/L — AB (ref 20–29)
CREATININE: 0.94 mg/dL (ref 0.76–1.27)
Calcium: 9.5 mg/dL (ref 8.7–10.2)
Chloride: 102 mmol/L (ref 96–106)
GFR calc Af Amer: 107 mL/min/{1.73_m2} (ref >59)
GFR calc non Af Amer: 92 mL/min/{1.73_m2} (ref >59)
GLUCOSE: 154 mg/dL — AB (ref 65–99)
Globulin, Total: 2.5 g/dL (ref 1.5–4.5)
Potassium: 5 mmol/L (ref 3.5–5.2)
Sodium: 137 mmol/L (ref 134–144)
Total Protein: 6.8 g/dL (ref 6.0–8.5)

## 2017-02-22 LAB — HGB A1C W/O EAG: Hgb A1c MFr Bld: 9 % — ABNORMAL HIGH (ref 4.8–5.6)

## 2017-02-22 LAB — TSH: TSH: 1.57 u[IU]/mL (ref 0.450–4.500)

## 2017-04-28 ENCOUNTER — Other Ambulatory Visit: Payer: Self-pay | Admitting: Emergency Medicine

## 2017-04-28 MED ORDER — RANITIDINE HCL 150 MG PO TABS: 150.0000 mg | ORAL_TABLET | Freq: Two times a day (BID) | ORAL | 12 refills | Status: DC

## 2017-04-28 NOTE — Telephone Encounter (Signed)
Med refill for zantac approved

## 2017-05-30 ENCOUNTER — Other Ambulatory Visit: Payer: Self-pay

## 2017-05-30 DIAGNOSIS — Z299 Encounter for prophylactic measures, unspecified: Secondary | ICD-10-CM

## 2017-05-30 NOTE — Progress Notes (Signed)
Patient came in to have blood drawn for an upcoming appointment with Dr. Gabriel Carina at [facility].

## 2017-05-31 LAB — CMP12+LP+TP+TSH+6AC+PSA+CBC…
A/G RATIO: 1.9 (ref 1.2–2.2)
ALBUMIN: 4.3 g/dL (ref 3.5–5.5)
ALK PHOS: 62 IU/L (ref 39–117)
ALT: 35 IU/L (ref 0–44)
AST: 35 IU/L (ref 0–40)
BASOS ABS: 0 10*3/uL (ref 0.0–0.2)
BUN / CREAT RATIO: 17 (ref 9–20)
BUN: 18 mg/dL (ref 6–24)
Basos: 0 %
Bilirubin Total: 0.3 mg/dL (ref 0.0–1.2)
CALCIUM: 9.4 mg/dL (ref 8.7–10.2)
CHOLESTEROL TOTAL: 119 mg/dL (ref 100–199)
CREATININE: 1.03 mg/dL (ref 0.76–1.27)
Chloride: 104 mmol/L (ref 96–106)
Chol/HDL Ratio: 3.6 ratio (ref 0.0–5.0)
EOS (ABSOLUTE): 0.2 10*3/uL (ref 0.0–0.4)
EOS: 2 %
Estimated CHD Risk: 0.6 times avg. (ref 0.0–1.0)
FREE THYROXINE INDEX: 2.1 (ref 1.2–4.9)
GFR, EST AFRICAN AMERICAN: 95 mL/min/{1.73_m2} (ref >59)
GFR, EST NON AFRICAN AMERICAN: 83 mL/min/{1.73_m2} (ref >59)
GGT: 36 IU/L (ref 0–65)
GLOBULIN, TOTAL: 2.3 g/dL (ref 1.5–4.5)
Glucose: 255 mg/dL — ABNORMAL HIGH (ref 65–99)
HDL: 33 mg/dL — AB (ref >39)
HEMOGLOBIN: 14.2 g/dL (ref 13.0–17.7)
Hematocrit: 44.5 % (ref 37.5–51.0)
IMMATURE GRANS (ABS): 0 10*3/uL (ref 0.0–0.1)
Immature Granulocytes: 0 %
Iron: 53 ug/dL (ref 38–169)
LDH: 160 IU/L (ref 121–224)
LDL Calculated: 36 mg/dL (ref 0–99)
Lymphocytes Absolute: 2.3 10*3/uL (ref 0.7–3.1)
Lymphs: 32 %
MCH: 29.2 pg (ref 26.6–33.0)
MCHC: 31.9 g/dL (ref 31.5–35.7)
MCV: 92 fL (ref 79–97)
MONOCYTES: 7 %
Monocytes Absolute: 0.5 10*3/uL (ref 0.1–0.9)
NEUTROS ABS: 4.2 10*3/uL (ref 1.4–7.0)
Neutrophils: 59 %
POTASSIUM: 5.4 mmol/L — AB (ref 3.5–5.2)
Phosphorus: 3.1 mg/dL (ref 2.5–4.5)
Platelets: 152 10*3/uL (ref 150–379)
Prostate Specific Ag, Serum: 1.8 ng/mL (ref 0.0–4.0)
RBC: 4.86 x10E6/uL (ref 4.14–5.80)
RDW: 13.8 % (ref 12.3–15.4)
Sodium: 136 mmol/L (ref 134–144)
T3 UPTAKE RATIO: 26 % (ref 24–39)
T4 TOTAL: 7.9 ug/dL (ref 4.5–12.0)
TRIGLYCERIDES: 249 mg/dL — AB (ref 0–149)
TSH: 1.85 u[IU]/mL (ref 0.450–4.500)
Total Protein: 6.6 g/dL (ref 6.0–8.5)
Uric Acid: 4.6 mg/dL (ref 3.7–8.6)
VLDL Cholesterol Cal: 50 mg/dL — ABNORMAL HIGH (ref 5–40)
WBC: 7.2 10*3/uL (ref 3.4–10.8)

## 2017-05-31 LAB — HGB A1C W/O EAG: HEMOGLOBIN A1C: 9.9 % — AB (ref 4.8–5.6)

## 2017-05-31 LAB — MICROALBUMIN / CREATININE URINE RATIO: CREATININE, UR: 50.1 mg/dL

## 2017-05-31 LAB — VITAMIN D 25 HYDROXY (VIT D DEFICIENCY, FRACTURES): VIT D 25 HYDROXY: 29.3 ng/mL — AB (ref 30.0–100.0)

## 2017-09-26 ENCOUNTER — Encounter: Payer: Self-pay | Admitting: Family Medicine

## 2017-09-26 ENCOUNTER — Ambulatory Visit: Payer: Self-pay | Admitting: Family Medicine

## 2017-09-26 VITALS — BP 138/78 | HR 104 | Temp 97.7°F

## 2017-09-26 DIAGNOSIS — J019 Acute sinusitis, unspecified: Secondary | ICD-10-CM

## 2017-09-26 DIAGNOSIS — J069 Acute upper respiratory infection, unspecified: Secondary | ICD-10-CM

## 2017-09-26 MED ORDER — IPRATROPIUM BROMIDE 0.03 % NA SOLN: 2.0000 | Freq: Three times a day (TID) | NASAL | 0 refills | Status: DC

## 2017-09-26 MED ORDER — AMOXICILLIN-POT CLAVULANATE 875-125 MG PO TABS: 1.0000 | ORAL_TABLET | Freq: Two times a day (BID) | ORAL | 0 refills | Status: DC

## 2017-09-26 MED ORDER — BENZONATATE 100 MG PO CAPS: 100.0000 mg | ORAL_CAPSULE | Freq: Three times a day (TID) | ORAL | 0 refills | Status: DC | PRN

## 2017-09-26 NOTE — Patient Instructions (Signed)
Drink plenty of fluids to stay well-hydrated, and try to get as much rest as you can despite this stressful time in your life.  Change from taking the Claritin to taking Claritin-D which you can buy over-the-counter  Use Atrovent (ipratropium) nasal spray 2 sprays each nostril 3 times daily as needed for head congestion and drainage.  Take plain Mucinex 12-hour pills which you can buy over-the-counter.  If you develop more congestion and drainage get the prescription for the antibiotic Augmentin filled and take 875 mg 1 pill twice daily.  If you develop more of a cough, you can begin using the Mucinex DM, and if needed for daytime cough I am giving you a prescription for something which is nonsedating, Tessalon (benzonatate) which you can take 1-2 pills 3 times daily.  Get rechecked if worse, but I would expect this to take a week or so to run its course.  Bad colds usually last from 5 days to 2 weeks.   Upper Respiratory Infection, Adult Most upper respiratory infections (URIs) are caused by a virus. A URI affects the nose, throat, and upper air passages. The most common type of URI is often called "the common cold." Follow these instructions at home:  Take medicines only as told by your doctor.  Gargle warm saltwater or take cough drops to comfort your throat as told by your doctor.  Use a warm mist humidifier or inhale steam from a shower to increase air moisture. This may make it easier to breathe.  Drink enough fluid to keep your pee (urine) clear or pale yellow.  Eat soups and other clear broths.  Have a healthy diet.  Rest as needed.  Go back to work when your fever is gone or your doctor says it is okay. ? You may need to stay home longer to avoid giving your URI to others. ? You can also wear a face mask and wash your hands often to prevent spread of the virus.  Use your inhaler more if you have asthma.  Do not use any tobacco products, including cigarettes, chewing  tobacco, or electronic cigarettes. If you need help quitting, ask your doctor. Contact a doctor if:  You are getting worse, not better.  Your symptoms are not helped by medicine.  You have chills.  You are getting more short of breath.  You have brown or red mucus.  You have yellow or brown discharge from your nose.  You have pain in your face, especially when you bend forward.  You have a fever.  You have puffy (swollen) neck glands.  You have pain while swallowing.  You have white areas in the back of your throat. Get help right away if:  You have very bad or constant: ? Headache. ? Ear pain. ? Pain in your forehead, behind your eyes, and over your cheekbones (sinus pain). ? Chest pain.  You have long-lasting (chronic) lung disease and any of the following: ? Wheezing. ? Long-lasting cough. ? Coughing up blood. ? A change in your usual mucus.  You have a stiff neck.  You have changes in your: ? Vision. ? Hearing. ? Thinking. ? Mood. This information is not intended to replace advice given to you by your health care provider. Make sure you discuss any questions you have with your health care provider. Document Released: 01/26/2008 Document Revised: 04/11/2016 Document Reviewed: 11/14/2013 Elsevier Interactive Patient Education  2018 [employer].

## 2017-09-26 NOTE — Progress Notes (Signed)
Patient ID: Christopher Dennis, male    DOB: 10/12/1965  Age: 52 y.o. MRN: [id number]  Chief Complaint  Patient presents with  . Sore Throat    x2 days  . Sinus Problem    x2 days    Subjective:   Patient is been getting sick for 2 or 3 days with sinus congestion and a sore throat.  He does not smoke.  This is a stressful time for him because his mother is in hospice and expected to die any day.  His father-in-law is at [facility] scheduled to have a brain tumor operated on later this morning.  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  He has the head pressure and nasal congestion.  This morning he sneezed out a lot of stuff.  He did get his flu shot.  He has a little bit of a sore throat.  Not coughing very much yet.  Current allergies, medications, problem list, past/family and social histories reviewed.  Objective:  BP 138/78   Pulse (!) 104   Temp 97.7 F (36.5 C) (Oral)   SpO2 97%   No major acute distress.  TMs normal.  Nose congested.  Sinuses a little tender.  Throat clear.  Neck supple without significant nodes.  Chest is clear to auscultation.  Heart regular without murmur.  Assessment & Plan:   Assessment: 1. Viral upper respiratory infection   2. Acute non-recurrent sinusitis, unspecified location       Plan: See instructions  No orders of the defined types were placed in this encounter.   Meds ordered this encounter  Medications  . amoxicillin-clavulanate (AUGMENTIN) 875-125 MG tablet    Sig: Take 1 tablet by mouth 2 (two) times daily.    Dispense:  20 tablet    Refill:  0  . benzonatate (TESSALON) 100 MG capsule    Sig: Take 1-2 capsules (100-200 mg total) by mouth 3 (three) times daily as needed for cough.    Dispense:  40 capsule    Refill:  0  . ipratropium (ATROVENT) 0.03 % nasal spray    Sig: Place 2 sprays into both nostrils 3 (three) times daily.    Dispense:  30 mL    Refill:  0         Patient Instructions    Drink plenty of fluids to stay well-hydrated, and try to get as much rest as you can despite this stressful time in your life.  Change from taking the Claritin to taking Claritin-D which you can buy over-the-counter  Use Atrovent (ipratropium) nasal spray 2 sprays each nostril 3 times daily as needed for head congestion and drainage.  Take plain Mucinex 12-hour pills which you can buy over-the-counter.  If you develop more congestion and drainage get the prescription for the antibiotic Augmentin filled and take 875 mg 1 pill twice daily.  If you develop more of a cough, you can begin using the Mucinex DM, and if needed for daytime cough I am giving you a prescription for something which is nonsedating, Tessalon (benzonatate) which you can take 1-2 pills 3 times daily.  Get rechecked if worse, but I would expect this to take a week or so to run its course.  Bad colds usually last from 5 days to 2 weeks.   Upper Respiratory Infection, Adult Most upper respiratory infections (URIs) are caused by a virus. A URI affects the nose, throat, and upper air passages. The most common type of URI  is often called "the common cold." Follow these instructions at home:  Take medicines only as told by your doctor.  Gargle warm saltwater or take cough drops to comfort your throat as told by your doctor.  Use a warm mist humidifier or inhale steam from a shower to increase air moisture. This may make it easier to breathe.  Drink enough fluid to keep your pee (urine) clear or pale yellow.  Eat soups and other clear broths.  Have a healthy diet.  Rest as needed.  Go back to work when your fever is gone or your doctor says it is okay. ? You may need to stay home longer to avoid giving your URI to others. ? You can also wear a face mask and wash your hands often to prevent spread of the virus.  Use your inhaler more if you have asthma.  Do not use any tobacco products, including cigarettes, chewing  tobacco, or electronic cigarettes. If you need help quitting, ask your doctor. Contact a doctor if:  You are getting worse, not better.  Your symptoms are not helped by medicine.  You have chills.  You are getting more short of breath.  You have brown or red mucus.  You have yellow or brown discharge from your nose.  You have pain in your face, especially when you bend forward.  You have a fever.  You have puffy (swollen) neck glands.  You have pain while swallowing.  You have white areas in the back of your throat. Get help right away if:  You have very bad or constant: ? Headache. ? Ear pain. ? Pain in your forehead, behind your eyes, and over your cheekbones (sinus pain). ? Chest pain.  You have long-lasting (chronic) lung disease and any of the following: ? Wheezing. ? Long-lasting cough. ? Coughing up blood. ? A change in your usual mucus.  You have a stiff neck.  You have changes in your: ? Vision. ? Hearing. ? Thinking. ? Mood. This information is not intended to replace advice given to you by your health care provider. Make sure you discuss any questions you have with your health care provider. Document Released: 01/26/2008 Document Revised: 04/11/2016 Document Reviewed: 11/14/2013 Elsevier Interactive Patient Education  2018 [employer].     No Follow-up on file.   HOPPER,DAVID, MD 09/26/2017

## 2017-11-22 ENCOUNTER — Other Ambulatory Visit: Payer: Self-pay

## 2017-11-22 ENCOUNTER — Ambulatory Visit: Payer: Self-pay | Admitting: Family Medicine

## 2017-11-22 VITALS — BP 121/78 | HR 89 | Temp 98.0°F | Resp 16 | Ht 72.0 in | Wt 230.0 lb

## 2017-11-22 DIAGNOSIS — E1165 Type 2 diabetes mellitus with hyperglycemia: Secondary | ICD-10-CM

## 2017-11-22 DIAGNOSIS — Z0189 Encounter for other specified special examinations: Principal | ICD-10-CM

## 2017-11-22 DIAGNOSIS — Z008 Encounter for other general examination: Secondary | ICD-10-CM

## 2017-11-22 DIAGNOSIS — E782 Mixed hyperlipidemia: Secondary | ICD-10-CM

## 2017-11-22 DIAGNOSIS — E559 Vitamin D deficiency, unspecified: Secondary | ICD-10-CM

## 2017-11-22 LAB — GLUCOSE, POCT (MANUAL RESULT ENTRY): POC Glucose: 174 mg/dL — AB (ref 70–99)

## 2017-11-22 NOTE — Progress Notes (Signed)
Subjective: Annual biometrics screening  Patient presents for his annual biometric screening. Patient reports a history of hypertension, diabetes, and hyperlipidemia.  Denies any symptoms or complaints today. PCP: None currently.  Endocrinologist: Dr. Gabriel Carina. Patient works as a International aid/development worker. Patient denies any other issues or concerns.   Review of Systems Constitutional: Unremarkable.  HEENT: Unremarkable. Gastrointestinal: Unremarkable. Respiratory: Unremarkable.   Cardiovascular: Unremarkable.  ROS otherwise negative.   Objective  Physical Exam General: Awake, alert and oriented. No acute distress. Well developed, hydrated and nourished. Appears stated age.  HEENT:  Supple neck without adenopathy. Sclera is non-icteric. The ear canal is clear without discharge. The tympanic membrane is normal in appearance with normal landmarks and cone of light. Nasal mucosa is pink and moist. Oral mucosa is pink and moist. The pharynx is normal in appearance without tonsillar swelling or exudates.  Skin: Skin in warm, dry and intact without rashes or lesions. Appropriate color for ethnicity. Cardiac: Heart rate and rhythm are normal. No murmurs, gallops, or rubs are auscultated.  Respiratory: The chest wall is symmetric and without deformity. No signs of respiratory distress. Lung sounds are clear in all lobes bilaterally without rales, ronchi, or wheezes.  Abdominal: Abdomen is soft, symmetric, and non-tender without distention. No masses, hepatomegaly, or splenomegaly are noted.  Neurological: The patient is awake, alert and oriented to person, place, and time with normal speech.  Memory is normal and thought processes intact. No gait abnormalities are appreciated.  Psychiatric: Appropriate mood and affect.   Assessment Annual biometrics screen  Plan  Lipid panel pending. Encouraged routine visits with primary care provider.  Provided patient with resources to establish care with a new primary  care provider and encouraged him to do so today. Fasting blood sugar for biometric screening is 174 today.  Advised patient to follow-up with his endocrinologist within the next month regarding this.

## 2017-11-23 LAB — COMPREHENSIVE METABOLIC PANEL
ALBUMIN: 4.6 g/dL (ref 3.5–5.5)
ALK PHOS: 64 IU/L (ref 39–117)
ALT: 30 IU/L (ref 0–44)
AST: 36 IU/L (ref 0–40)
Albumin/Globulin Ratio: 1.8 (ref 1.2–2.2)
BILIRUBIN TOTAL: 0.3 mg/dL (ref 0.0–1.2)
BUN / CREAT RATIO: 18 (ref 9–20)
BUN: 18 mg/dL (ref 6–24)
CHLORIDE: 102 mmol/L (ref 96–106)
CO2: 18 mmol/L — ABNORMAL LOW (ref 20–29)
Calcium: 9.7 mg/dL (ref 8.7–10.2)
Creatinine, Ser: 0.99 mg/dL (ref 0.76–1.27)
GFR calc Af Amer: 99 mL/min/{1.73_m2} (ref >59)
GFR calc non Af Amer: 86 mL/min/{1.73_m2} (ref >59)
GLOBULIN, TOTAL: 2.6 g/dL (ref 1.5–4.5)
Glucose: 164 mg/dL — ABNORMAL HIGH (ref 65–99)
POTASSIUM: 5.4 mmol/L — AB (ref 3.5–5.2)
SODIUM: 138 mmol/L (ref 134–144)
Total Protein: 7.2 g/dL (ref 6.0–8.5)

## 2017-11-23 LAB — HGB A1C W/O EAG: Hgb A1c MFr Bld: 8.9 % — ABNORMAL HIGH (ref 4.8–5.6)

## 2017-11-23 LAB — LIPID PANEL
CHOLESTEROL TOTAL: 113 mg/dL (ref 100–199)
Chol/HDL Ratio: 3.8 ratio (ref 0.0–5.0)
HDL: 30 mg/dL — ABNORMAL LOW (ref >39)
LDL Calculated: 46 mg/dL (ref 0–99)
Triglycerides: 184 mg/dL — ABNORMAL HIGH (ref 0–149)
VLDL Cholesterol Cal: 37 mg/dL (ref 5–40)

## 2017-11-23 LAB — VITAMIN D 25 HYDROXY (VIT D DEFICIENCY, FRACTURES): VIT D 25 HYDROXY: 35 ng/mL (ref 30.0–100.0)

## 2017-11-24 NOTE — Progress Notes (Signed)
Christopher Dennis, Will you call the patient and inform them that their lipid panel came back?  The total cholesterol is 113, normal values are between 100 and 199.  The triglyceride level is 184, normal values are between 0 and 149.  The VLDL cholesterol is 37, normal values are between 5 and 40. The HDL cholesterol ("good cholesterol") is 30, normal values are above 39.  The LDL cholesterol ("bad cholesterol") is 46, normal values are below 99.  The cholesterol/HDL ratio is 3.8, normal values are between 0 and 4.4 for women or 0 and 5 from men. Please advise the patient to discuss this with their primary care provider at their next regularly scheduled visit.

## 2018-02-20 ENCOUNTER — Telehealth: Payer: Self-pay

## 2018-02-20 NOTE — Telephone Encounter (Signed)
Received fax requesting a refill for Lisinopril 20mg .  Last OV- 11/22/17 Last lab- 11/22/17

## 2018-02-20 NOTE — Telephone Encounter (Signed)
Please inform the pharmacy to contact his primary care provider for refills of chronic medications. I saw him on 11/22/17 and told him he needed to establish care with a PCP so by now surely he has seen one.

## 2018-02-20 NOTE — Telephone Encounter (Signed)
Pharmacy notified refills need to come from his primary care provider.

## 2018-03-01 ENCOUNTER — Other Ambulatory Visit: Payer: Self-pay

## 2018-03-01 DIAGNOSIS — E1165 Type 2 diabetes mellitus with hyperglycemia: Secondary | ICD-10-CM

## 2018-03-02 DIAGNOSIS — E1165 Type 2 diabetes mellitus with hyperglycemia: Secondary | ICD-10-CM | POA: Insufficient documentation

## 2018-03-02 DIAGNOSIS — E119 Type 2 diabetes mellitus without complications: Secondary | ICD-10-CM | POA: Insufficient documentation

## 2018-03-02 LAB — COMPREHENSIVE METABOLIC PANEL
A/G RATIO: 1.8 (ref 1.2–2.2)
ALBUMIN: 4.3 g/dL (ref 3.5–5.5)
ALT: 27 IU/L (ref 0–44)
AST: 36 IU/L (ref 0–40)
Alkaline Phosphatase: 60 IU/L (ref 39–117)
BILIRUBIN TOTAL: 0.3 mg/dL (ref 0.0–1.2)
BUN/Creatinine Ratio: 19 (ref 9–20)
BUN: 17 mg/dL (ref 6–24)
CALCIUM: 9.5 mg/dL (ref 8.7–10.2)
CO2: 18 mmol/L — ABNORMAL LOW (ref 20–29)
Chloride: 105 mmol/L (ref 96–106)
Creatinine, Ser: 0.91 mg/dL (ref 0.76–1.27)
GFR, EST AFRICAN AMERICAN: 110 mL/min/{1.73_m2} (ref >59)
GFR, EST NON AFRICAN AMERICAN: 95 mL/min/{1.73_m2} (ref >59)
GLUCOSE: 173 mg/dL — AB (ref 65–99)
Globulin, Total: 2.4 g/dL (ref 1.5–4.5)
Potassium: 5.2 mmol/L (ref 3.5–5.2)
SODIUM: 137 mmol/L (ref 134–144)
TOTAL PROTEIN: 6.7 g/dL (ref 6.0–8.5)

## 2018-03-02 LAB — HGB A1C W/O EAG: HEMOGLOBIN A1C: 7.7 % — AB (ref 4.8–5.6)

## 2018-03-02 NOTE — Progress Notes (Signed)
Lab work ordered by Dr. Gabriel Carina.  Not sure why resulted to me.  Results sent to Dr. Gabriel Carina by Larene Beach, CMA to follow-up with patient.

## 2018-06-28 ENCOUNTER — Other Ambulatory Visit: Payer: Self-pay

## 2018-06-29 ENCOUNTER — Other Ambulatory Visit: Payer: Self-pay

## 2018-06-29 DIAGNOSIS — E785 Hyperlipidemia, unspecified: Secondary | ICD-10-CM

## 2018-06-29 DIAGNOSIS — E119 Type 2 diabetes mellitus without complications: Secondary | ICD-10-CM

## 2018-06-29 NOTE — Addendum Note (Signed)
Addended by: Judie Petit on: 06/29/2018 08:27 AM   Modules accepted: Level of Service

## 2018-06-30 LAB — COMPREHENSIVE METABOLIC PANEL
ALBUMIN: 4.3 g/dL (ref 3.5–5.5)
ALK PHOS: 60 IU/L (ref 39–117)
ALT: 30 IU/L (ref 0–44)
AST: 35 IU/L (ref 0–40)
Albumin/Globulin Ratio: 1.7 (ref 1.2–2.2)
BUN / CREAT RATIO: 18 (ref 9–20)
BUN: 18 mg/dL (ref 6–24)
Bilirubin Total: 0.3 mg/dL (ref 0.0–1.2)
CO2: 18 mmol/L — AB (ref 20–29)
CREATININE: 0.99 mg/dL (ref 0.76–1.27)
Calcium: 9.2 mg/dL (ref 8.7–10.2)
Chloride: 103 mmol/L (ref 96–106)
GFR calc Af Amer: 99 mL/min/{1.73_m2} (ref >59)
GFR calc non Af Amer: 86 mL/min/{1.73_m2} (ref >59)
GLOBULIN, TOTAL: 2.5 g/dL (ref 1.5–4.5)
Glucose: 147 mg/dL — ABNORMAL HIGH (ref 65–99)
Potassium: 4.7 mmol/L (ref 3.5–5.2)
Sodium: 136 mmol/L (ref 134–144)
Total Protein: 6.8 g/dL (ref 6.0–8.5)

## 2018-06-30 LAB — LIPID PANEL
CHOLESTEROL TOTAL: 126 mg/dL (ref 100–199)
Chol/HDL Ratio: 3.9 ratio (ref 0.0–5.0)
HDL: 32 mg/dL — AB (ref >39)
LDL Calculated: 56 mg/dL (ref 0–99)
Triglycerides: 191 mg/dL — ABNORMAL HIGH (ref 0–149)
VLDL CHOLESTEROL CAL: 38 mg/dL (ref 5–40)

## 2018-06-30 LAB — HGB A1C W/O EAG: Hgb A1c MFr Bld: 8.2 % — ABNORMAL HIGH (ref 4.8–5.6)

## 2018-06-30 LAB — TSH: TSH: 1.83 u[IU]/mL (ref 0.450–4.500)

## 2018-06-30 NOTE — Progress Notes (Signed)
Lab results from 06/29/18 have been routed to the ordering provider A. Lavone Orn MD 06/30/18 -[place]

## 2018-10-09 ENCOUNTER — Other Ambulatory Visit: Payer: Self-pay

## 2018-10-09 DIAGNOSIS — E11649 Type 2 diabetes mellitus with hypoglycemia without coma: Secondary | ICD-10-CM

## 2018-10-10 LAB — MICROALBUMIN / CREATININE URINE RATIO
CREATININE, UR: 104.6 mg/dL
MICROALBUM., U, RANDOM: 10.6 ug/mL
Microalb/Creat Ratio: 10 mg/g creat (ref 0–29)

## 2018-10-10 LAB — VITAMIN D 25 HYDROXY (VIT D DEFICIENCY, FRACTURES): VIT D 25 HYDROXY: 22.5 ng/mL — AB (ref 30.0–100.0)

## 2018-10-10 LAB — HGB A1C W/O EAG: HEMOGLOBIN A1C: 7.7 % — AB (ref 4.8–5.6)

## 2018-10-26 ENCOUNTER — Ambulatory Visit: Payer: Managed Care, Other (non HMO) | Admitting: Adult Health

## 2018-10-26 VITALS — BP 120/84 | HR 90 | Temp 98.1°F | Resp 13 | Ht 71.0 in | Wt 243.0 lb

## 2018-10-26 DIAGNOSIS — Z008 Encounter for other general examination: Secondary | ICD-10-CM

## 2018-10-26 DIAGNOSIS — Z0189 Encounter for other specified special examinations: Secondary | ICD-10-CM

## 2018-10-26 NOTE — Progress Notes (Addendum)
[facility]  Subjective:     Patient ID: Christopher Dennis, male   DOB: 1966-02-20, 53 y.o.   MRN: [id number]  HPI  Blood pressure 120/84, pulse 90, temperature 98.1 F (36.7 C), resp. rate 13, height _0  (1.803 m), weight 243 lb (110.2 kg), SpO2 99 %.  Body mass index is 33.89 kg/m.  Patient is a 53 year old male in no acute distress who comes to the clinic for a biometric screening with brief biometric screening with labs include fasting glucose and lipids.   He is diabetic followed by endocrinology.  He has been with [facility] for 16 years.  He reports he is trying to stay active and eat a healthy diet.  Patient  denies any fever, body aches,chills, rash, chest pain, shortness of breath, nausea, vomiting, or diarrhea.   No Known Allergies  Patient Active Problem List   Diagnosis Date Noted  . Uncontrolled type 2 diabetes mellitus with hyperglycemia, without long-term current use of insulin ([place]) 03/02/2018  . Type 2 diabetes mellitus ([place]) 03/02/2018  . Trouble swallowing   . Gastritis   . Stricture and stenosis of esophagus   . Special screening for malignant neoplasms, colon   . Benign neoplasm of sigmoid colon   . Fourth degree hemorrhoids   . DDD (degenerative disc disease), lumbosacral 12/11/2012  . Displacement of lumbar intervertebral disc without myelopathy 12/11/2012  . Thoracic or lumbosacral neuritis or radiculitis, unspecified 12/11/2012  . Spinal stenosis of lumbar region without neurogenic claudication 12/11/2012  . Back pain 12/04/2012  . CAD (coronary artery disease) 12/04/2012  . Clinical depression 12/04/2012  . Diabetes ([place]) 12/04/2012  . Elevated liver function tests 12/04/2012  . HTN (hypertension) 12/04/2012  . Hyperlipidemia 12/04/2012  . Hyponatremia 12/04/2012  . Seizure-like activity ([place]) 12/04/2012  . Neuropathy 12/04/2012  . Diabetes mellitus type 2, uncomplicated ([place]) 25/49/8264     Current Outpatient Medications:  .  Blood Glucose Monitoring Suppl (FIFTY50 GLUCOSE METER 2.0) w/Device KIT, Use as directed, Disp: , Rfl:  .  lisinopril (PRINIVIL,ZESTRIL) 20 MG tablet, Take by mouth., Disp: , Rfl:  .  Loratadine 10 MG CAPS, Take by mouth., Disp: , Rfl:  .  metFORMIN (GLUCOPHAGE) 1000 MG tablet, Take by mouth., Disp: , Rfl:  .  pioglitazone (ACTOS) 30 MG tablet, Take by mouth., Disp: , Rfl:  .  ranitidine (ZANTAC) 150 MG tablet, Take by mouth., Disp: , Rfl:  .  clopidogrel (PLAVIX) 75 MG tablet, Take 75 mg by mouth daily., Disp: , Rfl:  .  empagliflozin (JARDIANCE) 25 MG TABS tablet, TAKE 1 TABLET BY MOUTH EVERY DAY, Disp: , Rfl:  .  Insulin Glargine (LANTUS SOLOSTAR) 100 UNIT/ML Solostar Pen, Inject into the skin., Disp: , Rfl:  .  Insulin Pen Needle (BD PEN NEEDLE NANO U/F) 32G X 4 MM MISC, USE ONCE DAILY, Disp: , Rfl:  .  ONE TOUCH ULTRA TEST test strip, TEST AT LEAST TID, Disp: 100 each, Rfl: 12 .  simvastatin (ZOCOR) 40 MG tablet, , Disp: , Rfl: 6 .  Vitamin D, Ergocalciferol, (DRISDOL) 1.25 MG (50000 UT) CAPS capsule, Take by mouth., Disp: , Rfl:    Review of Systems  Constitutional: Negative.   HENT: Negative.   Respiratory: Negative.   Cardiovascular: Negative.   Gastrointestinal: Negative.   Genitourinary: Negative.   Musculoskeletal: Negative.   Skin: Negative.   Psychiatric/Behavioral: Negative.        Objective:   Physical Exam Constitutional:  General: He is not in acute distress.    Appearance: Normal appearance. He is not ill-appearing, toxic-appearing or diaphoretic.     Comments: Patient is alert and oriented and responsive to questions Engages in eye contact with provider. Speaks in full sentences without any pauses without any shortness of breath or distress.    HENT:     Head: Normocephalic and atraumatic.     Right Ear: Tympanic membrane, ear canal and external ear normal. There is no impacted cerumen.     Left Ear: Tympanic  membrane, ear canal and external ear normal. There is no impacted cerumen.     Nose: Nose normal. No congestion or rhinorrhea.     Mouth/Throat:     Mouth: Mucous membranes are moist.     Pharynx: No oropharyngeal exudate or posterior oropharyngeal erythema.  Eyes:     General: No scleral icterus.       Right eye: No discharge.        Left eye: No discharge.     Conjunctiva/sclera: Conjunctivae normal.  Neck:     Musculoskeletal: Normal range of motion. No neck rigidity or muscular tenderness.     Vascular: No carotid bruit.  Cardiovascular:     Heart sounds: Normal heart sounds. No murmur. No friction rub. No gallop.   Pulmonary:     Effort: Pulmonary effort is normal. No respiratory distress.     Breath sounds: Normal breath sounds. No stridor. No wheezing, rhonchi or rales.  Chest:     Chest wall: No tenderness.  Abdominal:     Palpations: Abdomen is soft.  Musculoskeletal: Normal range of motion.     Left lower leg: No edema.  Lymphadenopathy:     Cervical: No cervical adenopathy.  Skin:    General: Skin is warm and dry.     Capillary Refill: Capillary refill takes less than 2 seconds.  Neurological:     Mental Status: He is alert and oriented to person, place, and time.     Comments: Patient moves on and off of exam table and in room without difficulty. Gait is normal in hall and in room. Patient is oriented to person place time and situation. Patient answers questions appropriately and engages in conversation.   Psychiatric:        Mood and Affect: Mood normal.        Behavior: Behavior normal.        Thought Content: Thought content normal.        Judgment: Judgment normal.        Assessment:     Encounter for biometric screening      Plan:      He will follow up with his primary for male health maintenance and labs and for chronic health problems.    Follow up with primary care as needed for chronic and maintenance health care- can be seen in this  employee clinic for acute care.   Please see your primary care provider for a yearly physical and labs.  I will have the office call you on your glucose and cholesterol results when they return if you have not heard within 1 week please call the office and will have you follow up with your primary care doctor for any abnormal's. This biometric physical is not a substitute for seeing a primary care provider for a physical. Provider also recommends if you do not have a primary care provider for patient see a  primary care physician/ provider  for a  routine physical and to establish primary care. Patient may chose provider of choice. Also gave the [place] at [phone] or web site at [email or web address] to help assist with finding a primary care doctor. Patient understands this office is acute care and no primary care is in this office.     Follow up as needed.

## 2018-10-26 NOTE — Patient Instructions (Signed)
Please see your primary care provider for a yearly physical and labs.  I will have the office call you on your glucose and cholesterol results when they return if you have not heard within 1 week please call the office and will have you follow up with your primary care doctor for any abnormal's. This biometric physical is not a substitute for seeing a primary care provider for a physical. Provider also recommends if you do not have a primary care provider for patient see a  primary care physician/ provider  for a routine physical and to establish primary care. Patient may chose provider of choice. Also gave the [place]  PHYSICIAN/PROVIDER  REFERRAL LINE at [phone] or web site at [email or web address] to help assist with finding a primary care doctor. Patient understands this office is acute care and no primary care is in this office.   Follow up with primary care as needed for chronic and maintenance health care- can be seen in this employee clinic for acute care.   Health Maintenance, Male A healthy lifestyle and preventive care is important for your health and wellness. Ask your health care provider about what schedule of regular examinations is right for you. What should I know about weight and diet? Eat a Healthy Diet  Eat plenty of vegetables, fruits, whole grains, low-fat dairy products, and lean protein.  Do not eat a lot of foods high in solid fats, added sugars, or salt.  Maintain a Healthy Weight Regular exercise can help you achieve or maintain a healthy weight. You should:  Do at least 150 minutes of exercise each week. The exercise should increase your heart rate and make you sweat (moderate-intensity exercise).  Do strength-training exercises at least twice a week. Watch Your Levels of Cholesterol and Blood Lipids  Have your blood tested for lipids and cholesterol every 5 years starting at 55 years of age. If you are at high risk for heart disease, you should start having  your blood tested when you are 55 years old. You may need to have your cholesterol levels checked more often if: ? Your lipid or cholesterol levels are high. ? You are older than 55 years of age. ? You are at high risk for heart disease. What should I know about cancer screening? Many types of cancers can be detected early and may often be prevented. Lung Cancer  You should be screened every year for lung cancer if: ? You are a current smoker who has smoked for at least 30 years. ? You are a former smoker who has quit within the past 15 years.  Talk to your health care provider about your screening options, when you should start screening, and how often you should be screened. Colorectal Cancer  Routine colorectal cancer screening usually begins at 55 years of age and should be repeated every 5-10 years until you are 55 years old. You may need to be screened more often if early forms of precancerous polyps or small growths are found. Your health care provider may recommend screening at an earlier age if you have risk factors for colon cancer.  Your health care provider may recommend using home test kits to check for hidden blood in the stool.  A small camera at the end of a tube can be used to examine your colon (sigmoidoscopy or colonoscopy). This checks for the earliest forms of colorectal cancer. Prostate and Testicular Cancer  Depending on your age and overall health, your health care   provider may do certain tests to screen for prostate and testicular cancer.  Talk to your health care provider about any symptoms or concerns you have about testicular or prostate cancer. Skin Cancer  Check your skin from head to toe regularly.  Tell your health care provider about any new moles or changes in moles, especially if: ? There is a change in a mole's size, shape, or color. ? You have a mole that is larger than a pencil eraser.  Always use sunscreen. Apply sunscreen liberally and repeat  throughout the day.  Protect yourself by wearing long sleeves, pants, a wide-brimmed hat, and sunglasses when outside. What should I know about heart disease, diabetes, and high blood pressure?  If you are 18-39 years of age, have your blood pressure checked every 3-5 years. If you are 40 years of age or older, have your blood pressure checked every year. You should have your blood pressure measured twice-once when you are at a hospital or clinic, and once when you are not at a hospital or clinic. Record the average of the two measurements. To check your blood pressure when you are not at a hospital or clinic, you can use: ? An automated blood pressure machine at a pharmacy. ? A home blood pressure monitor.  Talk to your health care provider about your target blood pressure.  If you are between 45-79 years old, ask your health care provider if you should take aspirin to prevent heart disease.  Have regular diabetes screenings by checking your fasting blood sugar level. ? If you are at a normal weight and have a low risk for diabetes, have this test once every three years after the age of 45. ? If you are overweight and have a high risk for diabetes, consider being tested at a younger age or more often.  A one-time screening for abdominal aortic aneurysm (AAA) by ultrasound is recommended for men aged 65-75 years who are current or former smokers. What should I know about preventing infection? Hepatitis B If you have a higher risk for hepatitis B, you should be screened for this virus. Talk with your health care provider to find out if you are at risk for hepatitis B infection. Hepatitis C Blood testing is recommended for:  Everyone born from 1945 through 1965.  Anyone with known risk factors for hepatitis C. Sexually Transmitted Diseases (STDs)  You should be screened each year for STDs including gonorrhea and chlamydia if: ? You are sexually active and are younger than 55 years of age.  ? You are older than 55 years of age and your health care provider tells you that you are at risk for this type of infection. ? Your sexual activity has changed since you were last screened and you are at an increased risk for chlamydia or gonorrhea. Ask your health care provider if you are at risk.  Talk with your health care provider about whether you are at high risk of being infected with HIV. Your health care provider may recommend a prescription medicine to help prevent HIV infection. What else can I do?  Schedule regular health, dental, and eye exams.  Stay current with your vaccines (immunizations).  Do not use any tobacco products, such as cigarettes, chewing tobacco, and e-cigarettes. If you need help quitting, ask your health care provider.  Limit alcohol intake to no more than 2 drinks per day. One drink equals 12 ounces of beer, 5 ounces of wine, or 1 ounces of hard   liquor.  Do not use street drugs.  Do not share needles.  Ask your health care provider for help if you need support or information about quitting drugs.  Tell your health care provider if you often feel depressed.  Tell your health care provider if you have ever been abused or do not feel safe at home. This information is not intended to replace advice given to you by your health care provider. Make sure you discuss any questions you have with your health care provider. Document Released: 02/05/2008 Document Revised: 04/07/2016 Document Reviewed: 05/13/2015 Elsevier Interactive Patient Education  2019 [employer]  

## 2018-12-14 NOTE — Addendum Note (Signed)
Addended by: Doreen Beam on: 12/14/2018 10:05 AM   Modules accepted: Level of Service

## 2019-02-07 ENCOUNTER — Other Ambulatory Visit: Payer: Self-pay

## 2019-02-07 ENCOUNTER — Other Ambulatory Visit: Payer: Managed Care, Other (non HMO)

## 2019-02-07 DIAGNOSIS — E1169 Type 2 diabetes mellitus with other specified complication: Secondary | ICD-10-CM | POA: Diagnosis not present

## 2019-02-07 DIAGNOSIS — E1165 Type 2 diabetes mellitus with hyperglycemia: Secondary | ICD-10-CM

## 2019-02-07 DIAGNOSIS — IMO0002 Reserved for concepts with insufficient information to code with codable children: Secondary | ICD-10-CM

## 2019-02-08 LAB — HGB A1C W/O EAG: Hgb A1c MFr Bld: 6.8 % — ABNORMAL HIGH (ref 4.8–5.6)

## 2019-02-08 LAB — COMPREHENSIVE METABOLIC PANEL
ALT: 31 IU/L (ref 0–44)
AST: 37 IU/L (ref 0–40)
Albumin/Globulin Ratio: 2.1 (ref 1.2–2.2)
Albumin: 4.8 g/dL (ref 3.8–4.9)
Alkaline Phosphatase: 54 IU/L (ref 39–117)
BUN/Creatinine Ratio: 20 (ref 9–20)
BUN: 22 mg/dL (ref 6–24)
Bilirubin Total: 0.4 mg/dL (ref 0.0–1.2)
CO2: 24 mmol/L (ref 20–29)
Calcium: 10 mg/dL (ref 8.7–10.2)
Chloride: 102 mmol/L (ref 96–106)
Creatinine, Ser: 1.1 mg/dL (ref 0.76–1.27)
GFR calc Af Amer: 87 mL/min/{1.73_m2} (ref >59)
GFR calc non Af Amer: 75 mL/min/{1.73_m2} (ref >59)
Globulin, Total: 2.3 g/dL (ref 1.5–4.5)
Glucose: 119 mg/dL — ABNORMAL HIGH (ref 65–99)
Potassium: 5.3 mmol/L — ABNORMAL HIGH (ref 3.5–5.2)
Sodium: 138 mmol/L (ref 134–144)
Total Protein: 7.1 g/dL (ref 6.0–8.5)

## 2019-02-08 LAB — LIPID PANEL WITH LDL/HDL RATIO
Cholesterol, Total: 176 mg/dL (ref 100–199)
HDL: 45 mg/dL (ref >39)
LDL Calculated: 106 mg/dL — ABNORMAL HIGH (ref 0–99)
LDl/HDL Ratio: 2.4 ratio (ref 0.0–3.6)
Triglycerides: 127 mg/dL (ref 0–149)
VLDL Cholesterol Cal: 25 mg/dL (ref 5–40)

## 2019-06-28 ENCOUNTER — Other Ambulatory Visit: Payer: Self-pay | Admitting: *Deleted

## 2019-06-28 DIAGNOSIS — Z20822 Contact with and (suspected) exposure to covid-19: Secondary | ICD-10-CM

## 2019-06-29 LAB — NOVEL CORONAVIRUS, NAA: SARS-CoV-2, NAA: NOT DETECTED

## 2019-07-26 ENCOUNTER — Other Ambulatory Visit: Payer: Managed Care, Other (non HMO)

## 2019-07-26 ENCOUNTER — Other Ambulatory Visit: Payer: Self-pay

## 2019-07-26 DIAGNOSIS — E1165 Type 2 diabetes mellitus with hyperglycemia: Secondary | ICD-10-CM | POA: Diagnosis not present

## 2019-07-26 DIAGNOSIS — E559 Vitamin D deficiency, unspecified: Secondary | ICD-10-CM

## 2019-07-27 LAB — BASIC METABOLIC PANEL
BUN/Creatinine Ratio: 21 — ABNORMAL HIGH (ref 9–20)
BUN: 20 mg/dL (ref 6–24)
CO2: 23 mmol/L (ref 20–29)
Calcium: 10.3 mg/dL — ABNORMAL HIGH (ref 8.7–10.2)
Chloride: 103 mmol/L (ref 96–106)
Creatinine, Ser: 0.94 mg/dL (ref 0.76–1.27)
GFR calc Af Amer: 105 mL/min/{1.73_m2} (ref >59)
GFR calc non Af Amer: 91 mL/min/{1.73_m2} (ref >59)
Glucose: 155 mg/dL — ABNORMAL HIGH (ref 65–99)
Potassium: 5.1 mmol/L (ref 3.5–5.2)
Sodium: 139 mmol/L (ref 134–144)

## 2019-07-27 LAB — MICROALBUMIN / CREATININE URINE RATIO
Creatinine, Urine: 85.3 mg/dL
Microalb/Creat Ratio: 6 mg/g creat (ref 0–29)
Microalbumin, Urine: 5 ug/mL

## 2019-07-27 LAB — HGB A1C W/O EAG: Hgb A1c MFr Bld: 7.3 % — ABNORMAL HIGH (ref 4.8–5.6)

## 2019-07-27 LAB — VITAMIN D 25 HYDROXY (VIT D DEFICIENCY, FRACTURES): Vit D, 25-Hydroxy: 32.2 ng/mL (ref 30.0–100.0)

## 2019-08-01 DIAGNOSIS — E559 Vitamin D deficiency, unspecified: Secondary | ICD-10-CM | POA: Insufficient documentation

## 2019-10-22 ENCOUNTER — Encounter: Payer: Self-pay | Admitting: *Deleted

## 2019-10-22 ENCOUNTER — Emergency Department
Admission: EM | Admit: 2019-10-22 | Discharge: 2019-10-22 | Disposition: A | Discharge status: Home / Self Care | Payer: Managed Care, Other (non HMO) | Attending: Emergency Medicine | Admitting: Emergency Medicine

## 2019-10-22 ENCOUNTER — Other Ambulatory Visit: Payer: Self-pay

## 2019-10-22 ENCOUNTER — Emergency Department: Payer: Managed Care, Other (non HMO)

## 2019-10-22 DIAGNOSIS — I2510 Atherosclerotic heart disease of native coronary artery without angina pectoris: Secondary | ICD-10-CM | POA: Diagnosis not present

## 2019-10-22 DIAGNOSIS — Z794 Long term (current) use of insulin: Secondary | ICD-10-CM | POA: Insufficient documentation

## 2019-10-22 DIAGNOSIS — I119 Hypertensive heart disease without heart failure: Secondary | ICD-10-CM | POA: Insufficient documentation

## 2019-10-22 DIAGNOSIS — E119 Type 2 diabetes mellitus without complications: Secondary | ICD-10-CM | POA: Insufficient documentation

## 2019-10-22 DIAGNOSIS — R1110 Vomiting, unspecified: Secondary | ICD-10-CM | POA: Diagnosis not present

## 2019-10-22 DIAGNOSIS — Z7902 Long term (current) use of antithrombotics/antiplatelets: Secondary | ICD-10-CM | POA: Insufficient documentation

## 2019-10-22 DIAGNOSIS — U071 COVID-19: Secondary | ICD-10-CM | POA: Insufficient documentation

## 2019-10-22 DIAGNOSIS — Z79899 Other long term (current) drug therapy: Secondary | ICD-10-CM | POA: Diagnosis not present

## 2019-10-22 DIAGNOSIS — R0602 Shortness of breath: Secondary | ICD-10-CM | POA: Insufficient documentation

## 2019-10-22 LAB — CBC
HCT: 44.2 % (ref 39.0–52.0)
Hemoglobin: 14.7 g/dL (ref 13.0–17.0)
MCH: 29.8 pg (ref 26.0–34.0)
MCHC: 33.3 g/dL (ref 30.0–36.0)
MCV: 89.5 fL (ref 80.0–100.0)
Platelets: 162 10*3/uL (ref 150–400)
RBC: 4.94 MIL/uL (ref 4.22–5.81)
RDW: 13.3 % (ref 11.5–15.5)
WBC: 6.3 10*3/uL (ref 4.0–10.5)
nRBC: 0 % (ref 0.0–0.2)

## 2019-10-22 LAB — BASIC METABOLIC PANEL
Anion gap: 10 (ref 5–15)
BUN: 20 mg/dL (ref 6–20)
CO2: 23 mmol/L (ref 22–32)
Calcium: 8.5 mg/dL — ABNORMAL LOW (ref 8.9–10.3)
Chloride: 101 mmol/L (ref 98–111)
Creatinine, Ser: 0.98 mg/dL (ref 0.61–1.24)
GFR calc Af Amer: >60 mL/min (ref >60)
GFR calc non Af Amer: >60 mL/min (ref >60)
Glucose, Bld: 111 mg/dL — ABNORMAL HIGH (ref 70–99)
Potassium: 4.5 mmol/L (ref 3.5–5.1)
Sodium: 134 mmol/L — ABNORMAL LOW (ref 135–145)

## 2019-10-22 LAB — LACTIC ACID, PLASMA: Lactic Acid, Venous: 1.3 mmol/L (ref 0.5–1.9)

## 2019-10-22 LAB — TROPONIN I (HIGH SENSITIVITY): Troponin I (High Sensitivity): 5 ng/L (ref <18)

## 2019-10-22 MED ORDER — AZITHROMYCIN 500 MG PO TABS
500.0000 mg | ORAL_TABLET | Freq: Once | ORAL | Status: AC
  Administered 2019-10-22: 500 mg via ORAL
  Filled 2019-10-22: qty 1

## 2019-10-22 MED ORDER — AZITHROMYCIN 250 MG PO TABS: ORAL_TABLET | ORAL | 0 refills | Status: DC

## 2019-10-22 MED ORDER — KETOROLAC TROMETHAMINE 30 MG/ML IJ SOLN
30.0000 mg | Freq: Once | INTRAMUSCULAR | Status: AC
  Administered 2019-10-22: 30 mg via INTRAVENOUS
  Filled 2019-10-22: qty 1

## 2019-10-22 MED ORDER — DEXAMETHASONE SODIUM PHOSPHATE 10 MG/ML IJ SOLN
8.0000 mg | Freq: Once | INTRAMUSCULAR | Status: AC
  Administered 2019-10-22: 8 mg via INTRAVENOUS
  Filled 2019-10-22: qty 1

## 2019-10-22 MED ORDER — ONDANSETRON HCL 4 MG/2ML IJ SOLN
4.0000 mg | Freq: Once | INTRAMUSCULAR | Status: AC
  Administered 2019-10-22: 4 mg via INTRAVENOUS
  Filled 2019-10-22: qty 2

## 2019-10-22 MED ORDER — SODIUM CHLORIDE 0.9 % IV BOLUS
500.0000 mL | Freq: Once | INTRAVENOUS | Status: AC
  Administered 2019-10-22: 500 mL via INTRAVENOUS

## 2019-10-22 MED ORDER — ALBUTEROL SULFATE HFA 108 (90 BASE) MCG/ACT IN AERS: 2.0000 | INHALATION_SPRAY | Freq: Four times a day (QID) | RESPIRATORY_TRACT | 0 refills | Status: DC | PRN

## 2019-10-22 MED ORDER — ALBUTEROL SULFATE HFA 108 (90 BASE) MCG/ACT IN AERS
1.0000 | INHALATION_SPRAY | Freq: Once | RESPIRATORY_TRACT | Status: AC
  Administered 2019-10-22: 1 via RESPIRATORY_TRACT
  Filled 2019-10-22: qty 6.7

## 2019-10-22 NOTE — ED Notes (Signed)
Pt reports symptoms started on Wednesday, he tested positive on Saturday. Pt reports SOB has not worsened, but he began vomiting this AM

## 2019-10-22 NOTE — ED Notes (Addendum)
First Nurse Note:  Pt in via [facility] from home with complaints of shortness of breath, N/V/D.  Pt is Covid+.  Pt alert, NAD noted at this time.  Zofran 4mg , 569mL Saline bolus given per EMS prior to arrival.    CBG 186.

## 2019-10-22 NOTE — ED Notes (Signed)
Pt ambulated, well tolerated. SpO2 maintained between 97-99% with little increase in work of breathing

## 2019-10-22 NOTE — ED Provider Notes (Signed)
[facility] Emergency Department Provider Note  ____________________________________________  Time seen: Approximately 4:05 PM  I have reviewed the triage vital signs and the nursing notes.   HISTORY  Chief Complaint Shortness of Breath    HPI Christopher Dennis is a 54 y.o. male that presents to the emergency department for evaluation of chills, headache, nasal congestion, cough for 5 days.  Patient states that he is short of breath but clarifies that this is primarily because he is unable to breathe out of his nose.  He does occasionally feel short of breath though too. He vomited once this morning.  He has had diarrhea but none today.  Patient tested positive for Covid a couple of days ago at urgent care.  He has not checked his temperature.  Patient has diabetes and states his blood sugars are controlled.  No chest pain, abdominal pain.  Past Medical History:  Diagnosis Date  . Abnormal LFTs 12/04/2012  . Arteriosclerosis of coronary artery 12/04/2012  . Back ache 12/04/2012  . BP (high blood pressure) 12/04/2012  . Clinical depression 12/04/2012  . DDD (degenerative disc disease), lumbosacral 12/11/2012  . Diabetes ([place])   . Displacement of lumbar intervertebral disc without myelopathy 12/11/2012  . HLD (hyperlipidemia) 12/04/2012  . Lumbar canal stenosis 12/11/2012  . Neuropathy 12/04/2012  . Sleep apnea    CPAP - hasn't been using, no sure if it works  . 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 Uncontrolled type 2 diabetes mellitus with hyperglycemia, without long-term current use of insulin ([place]) 03/02/2018  . Type 2 diabetes mellitus ([place]) 03/02/2018  . Trouble swallowing   . Gastritis   . Stricture and stenosis of esophagus   . Special screening for malignant neoplasms, colon   . Benign neoplasm of sigmoid colon   . Fourth degree hemorrhoids   . DDD (degenerative disc disease),  lumbosacral 12/11/2012  . Displacement of lumbar intervertebral disc without myelopathy 12/11/2012  . Thoracic or lumbosacral neuritis or radiculitis, unspecified 12/11/2012  . Spinal stenosis of lumbar region without neurogenic claudication 12/11/2012  . Back pain 12/04/2012  . CAD (coronary artery disease) 12/04/2012  . Clinical depression 12/04/2012  . Diabetes ([place]) 12/04/2012  . Elevated liver function tests 12/04/2012  . HTN (hypertension) 12/04/2012  . Hyperlipidemia 12/04/2012  . Hyponatremia 12/04/2012  . Seizure-like activity ([place]) 12/04/2012  . Neuropathy 12/04/2012  . Diabetes mellitus type 2, uncomplicated ([place]) [id number]    Past Surgical History:  Procedure Laterality Date  . BACK SURGERY  2000  . COLONOSCOPY WITH PROPOFOL N/A 05/12/2015   Procedure: COLONOSCOPY WITH PROPOFOL;  Surgeon: Lucilla Lame, MD;  Location: [facility];  Service: Endoscopy;  Laterality: N/A;  . CORONARY ANGIOPLASTY WITH STENT PLACEMENT  06/17/2011  . ESOPHAGOGASTRODUODENOSCOPY (EGD) WITH PROPOFOL N/A 05/12/2015   Procedure: ESOPHAGOGASTRODUODENOSCOPY (EGD) WITH PROPOFOL;  Surgeon: Lucilla Lame, MD;  Location: [facility];  Service: Endoscopy;  Laterality: N/A;  CPAP  . KNEE ARTHROSCOPY Right 09/19/2014  . Laminectomy posterior cervicle decomp    . laminectomy posterior lumbar facetectomy & Foraminotomy w/decomp  12/15/2012  . meniscus tear  2006  . partial medial and extensive lateral meniscectomy    . POLYPECTOMY  05/12/2015   Procedure: POLYPECTOMY;  Surgeon: Lucilla Lame, MD;  Location: [facility];  Service: Endoscopy;;    Prior to Admission medications   Medication Sig Start Date End Date Taking? Authorizing Provider  albuterol (VENTOLIN HFA)  108 (90 Base) MCG/ACT inhaler Inhale 2 puffs into the lungs every 6 (six) hours as needed for wheezing or shortness of breath. 10/22/19   Laban Emperor, PA-C  azithromycin (ZITHROMAX Z-PAK) 250 MG tablet Take 2 tablets (500 mg) on   Day 1,  followed by 1 tablet (250 mg) once daily on Days 2 through 5. 10/22/19   Laban Emperor, PA-C  clopidogrel (PLAVIX) 75 MG tablet Take 75 mg by mouth daily.    [provider]  empagliflozin (JARDIANCE) 25 MG TABS tablet TAKE 1 TABLET BY MOUTH EVERY DAY 12/26/17   [provider]  Insulin Glargine (LANTUS SOLOSTAR) 100 UNIT/ML Solostar Pen Inject into the skin.    [provider]  Insulin Pen Needle (BD PEN NEEDLE NANO U/F) 32G X 4 MM MISC USE ONCE DAILY 10/31/17   [provider]  lisinopril (PRINIVIL,ZESTRIL) 20 MG tablet Take by mouth. 03/02/18   [provider]  Loratadine 10 MG CAPS Take by mouth. 03/02/18   [provider]  metFORMIN (GLUCOPHAGE) 1000 MG tablet Take by mouth. 03/02/18   [provider]  ONE TOUCH ULTRA TEST test strip TEST AT LEAST TID 10/05/16   Caryn Section Linden Dolin, PA-C  pioglitazone (ACTOS) 30 MG tablet Take by mouth. 07/05/18 07/05/19  [provider]  ranitidine (ZANTAC) 150 MG tablet Take by mouth. 03/02/18   [provider]  simvastatin (ZOCOR) 40 MG tablet  02/02/16   [provider]  Vitamin D, Ergocalciferol, (DRISDOL) 1.25 MG (50000 UT) CAPS capsule Take by mouth. 06/01/17   [provider]    Allergies Patient has no known allergies.  Family History  Problem Relation Age of Onset  . Diabetes Mother   . COPD Mother   . Alcohol abuse Father   . Asthma Sister     Social History Social History   Tobacco Use  . Smoking status: Former Smoker    Quit date: 01/21/2009    Years since quitting: 10.7  . Smokeless tobacco: Current User    Types: Snuff  Substance Use Topics  . Alcohol use: Yes    Alcohol/week: 0.0 standard drinks    Comment: rarely - 1 beer/month  . Drug use: No     Review of Systems  Constitutional: No fever/chills Eyes: No visual changes. No discharge. ENT: Positive for congestion and rhinorrhea. Cardiovascular: No chest pain. Respiratory:  Positive for cough and SOB. Gastrointestinal: No abdominal pain.  Positive for vomiting x1.  No diarrhea today.  No constipation. Musculoskeletal: Negative for musculoskeletal pain. Skin: Negative for rash, abrasions, lacerations, ecchymosis. Neurological: Positive for headache.   ____________________________________________   PHYSICAL EXAM:  VITAL SIGNS: ED Triage Vitals  Enc Vitals Group     BP 10/22/19 1457 108/65     Pulse Rate 10/22/19 1457 99     Resp 10/22/19 1457 (!) 24     Temp 10/22/19 1457 98.5 F (36.9 C)     Temp Source 10/22/19 1457 Oral     SpO2 10/22/19 1457 99 %     Weight 10/22/19 1458 240 lb (108.9 kg)     Height 10/22/19 1458 6' (1.829 m)     Head Circumference --      Peak Flow --      Pain Score 10/22/19 1511 8     Pain Loc --      Pain Edu? --      Excl. in [place]? --      Constitutional: Alert and oriented. Well appearing and  in no acute distress. Eyes: Conjunctivae are normal. PERRL. EOMI. No discharge. Head: Atraumatic. ENT: No frontal and maxillary sinus tenderness.      Ears: Tympanic membranes pearly gray with good landmarks. No discharge.      Nose: Mild congestion/rhinnorhea.      Mouth/Throat: Mucous membranes are moist. Oropharynx non-erythematous. Tonsils not enlarged. No exudates. Uvula midline. Neck: No stridor.   Hematological/Lymphatic/Immunilogical: No cervical lymphadenopathy. Cardiovascular: Normal rate, regular rhythm.  Good peripheral circulation. Respiratory: Normal respiratory effort without tachypnea or retractions. Lungs CTAB. Good air entry to the bases with no decreased or absent breath sounds. Gastrointestinal: Bowel sounds 4 quadrants. Soft and nontender to palpation. No guarding or rigidity. No palpable masses. No distention. Musculoskeletal: Full range of motion to all extremities. No gross deformities appreciated. Neurologic:  Normal speech and language. No gross focal neurologic deficits are appreciated.  Skin:  Skin  is warm, dry and intact. No rash noted. Psychiatric: Mood and affect are normal. Speech and behavior are normal. Patient exhibits appropriate insight and judgement.   ____________________________________________   LABS (all labs ordered are listed, but only abnormal results are displayed)  Labs Reviewed  BASIC METABOLIC PANEL - Abnormal; Notable for the following components:      Result Value   Sodium 134 (*)    Glucose, Bld 111 (*)    Calcium 8.5 (*)    All other components within normal limits  CBC  LACTIC ACID, PLASMA  TROPONIN I (HIGH SENSITIVITY)   ____________________________________________  EKG  SR ____________________________________________  RADIOLOGY Robinette Haines, personally viewed and evaluated these images (plain radiographs) as part of my medical decision making, as well as reviewing the written report by the radiologist.  DG Chest 2 View  Result Date: 10/22/2019 CLINICAL DATA:  54 year old male with shortness of breath. EXAM: CHEST - 2 VIEW COMPARISON:  Chest radiograph dated 06/08/2011. FINDINGS: Shallow inspiration. Faint bilateral interstitial and peribronchial densities may represent atelectasis or developing infiltrate. Clinical correlation is recommended. No focal consolidation, pleural effusion, or pneumothorax. The cardiac silhouette is within normal limits. No acute osseous pathology. Lower cervical ACDF. IMPRESSION: Faint bilateral interstitial and peribronchial densities may represent atelectasis or developing infiltrate. Electronically Signed   By: Anner Crete M.D.   On: 10/22/2019 15:47    ____________________________________________    PROCEDURES  Procedure(s) performed:    Procedures    Medications  sodium chloride 0.9 % bolus 500 mL (0 mLs Intravenous Stopped 10/22/19 1802)  ondansetron (ZOFRAN) injection 4 mg (4 mg Intravenous Given 10/22/19 1636)  dexamethasone (DECADRON) injection 8 mg (8 mg Intravenous Given 10/22/19 1801)   ketorolac (TORADOL) 30 MG/ML injection 30 mg (30 mg Intravenous Given 10/22/19 1820)  albuterol (VENTOLIN HFA) 108 (90 Base) MCG/ACT inhaler 1 puff (1 puff Inhalation Given 10/22/19 2052)  azithromycin (ZITHROMAX) tablet 500 mg (500 mg Oral Given 10/22/19 2051)     ____________________________________________   INITIAL IMPRESSION / ASSESSMENT AND PLAN / ED COURSE  Pertinent labs & imaging results that were available during my care of the patient were reviewed by me and considered in my medical decision making (see chart for details).  Review of the [place] CSRS was performed in accordance of the [place] prior to dispensing any controlled drugs.  Patient's diagnosis is consistent with Covid 19. Vital signs and exam are reassuring.  Chest x-ray shows faint bilateral interstitial and peribronchial densities, most consistent with known COVID-19.  EKG shows normal sinus rhythm.  Lab work is largely unremarkable.  Lactic acid is not  elevated.  Troponin within reference range.  Ambulatory oxygen saturations remained between 97 and 100%.  Patient was given IV fluids, IV Decadron, IV Toradol for headache and body aches and IV Zofran for nausea.  He was also given his first dose of oral azithromycin.  Patient is up now eating crackers and drinking soda.  Patient appears well and is staying well hydrated.  Patient feels comfortable going home. Patient will be discharged home with prescriptions for azithromycin. Patient is to follow up with primary care as needed or otherwise directed. Patient is given ED precautions to return to the ED for any worsening or new symptoms.  Christopher Dennis was evaluated in Emergency Department on 10/22/2019 for the symptoms described in the history of present illness. He was evaluated in the context of the global COVID-19 pandemic, which necessitated consideration that the patient might be at risk for infection with the SARS-CoV-2 virus that causes COVID-19. Institutional protocols and  algorithms that pertain to the evaluation of patients at risk for COVID-19 are in a state of rapid change based on information released by regulatory bodies including the CDC and federal and state organizations. These policies and algorithms were followed during the patient's care in the ED.   ____________________________________________  FINAL CLINICAL IMPRESSION(S) / ED DIAGNOSES  Final diagnoses:  COVID-19      NEW MEDICATIONS STARTED DURING THIS VISIT:  ED Discharge Orders         Ordered    azithromycin (ZITHROMAX Z-PAK) 250 MG tablet     10/22/19 1953    albuterol (VENTOLIN HFA) 108 (90 Base) MCG/ACT inhaler  Every 6 hours PRN     10/22/19 1953              This chart was dictated using voice recognition software/Dragon. Despite best efforts to proofread, errors can occur which can change the meaning. Any change was purely unintentional.    Laban Emperor, PA-C 10/22/19 2213    Arta Silence, MD 10/22/19 [phone]

## 2019-10-22 NOTE — ED Triage Notes (Signed)
Pt brought in via ems from home with sob.  Pt was dx with covid 2 days at [facility].  Pt has body aches, cough and sob.  No chest pain.  Pt alert  Speech clear.  Iv in place.

## 2019-10-26 ENCOUNTER — Emergency Department: Payer: Managed Care, Other (non HMO)

## 2019-10-26 ENCOUNTER — Other Ambulatory Visit: Payer: Self-pay

## 2019-10-26 ENCOUNTER — Inpatient Hospital Stay
Admission: EM | Admit: 2019-10-26 | Discharge: 2019-10-29 | DRG: 177 | Disposition: A | Discharge status: Home / Self Care | Payer: Managed Care, Other (non HMO) | Attending: Hospitalist | Admitting: Hospitalist

## 2019-10-26 ENCOUNTER — Encounter: Payer: Self-pay | Admitting: Emergency Medicine

## 2019-10-26 DIAGNOSIS — U071 COVID-19: Principal | ICD-10-CM | POA: Diagnosis present

## 2019-10-26 DIAGNOSIS — I959 Hypotension, unspecified: Secondary | ICD-10-CM | POA: Diagnosis present

## 2019-10-26 DIAGNOSIS — Z87891 Personal history of nicotine dependence: Secondary | ICD-10-CM | POA: Diagnosis not present

## 2019-10-26 DIAGNOSIS — R0602 Shortness of breath: Secondary | ICD-10-CM

## 2019-10-26 DIAGNOSIS — Z833 Family history of diabetes mellitus: Secondary | ICD-10-CM | POA: Diagnosis not present

## 2019-10-26 DIAGNOSIS — E785 Hyperlipidemia, unspecified: Secondary | ICD-10-CM | POA: Diagnosis present

## 2019-10-26 DIAGNOSIS — Z955 Presence of coronary angioplasty implant and graft: Secondary | ICD-10-CM

## 2019-10-26 DIAGNOSIS — Z23 Encounter for immunization: Secondary | ICD-10-CM | POA: Diagnosis not present

## 2019-10-26 DIAGNOSIS — Z79899 Other long term (current) drug therapy: Secondary | ICD-10-CM | POA: Diagnosis not present

## 2019-10-26 DIAGNOSIS — Z794 Long term (current) use of insulin: Secondary | ICD-10-CM | POA: Diagnosis not present

## 2019-10-26 DIAGNOSIS — I10 Essential (primary) hypertension: Secondary | ICD-10-CM | POA: Diagnosis present

## 2019-10-26 DIAGNOSIS — E1142 Type 2 diabetes mellitus with diabetic polyneuropathy: Secondary | ICD-10-CM | POA: Diagnosis present

## 2019-10-26 DIAGNOSIS — I2510 Atherosclerotic heart disease of native coronary artery without angina pectoris: Secondary | ICD-10-CM | POA: Diagnosis present

## 2019-10-26 DIAGNOSIS — Z7902 Long term (current) use of antithrombotics/antiplatelets: Secondary | ICD-10-CM | POA: Diagnosis not present

## 2019-10-26 DIAGNOSIS — E119 Type 2 diabetes mellitus without complications: Secondary | ICD-10-CM

## 2019-10-26 DIAGNOSIS — J1282 Pneumonia due to coronavirus disease 2019: Secondary | ICD-10-CM

## 2019-10-26 LAB — PROCALCITONIN: Procalcitonin: <0.1 ng/mL

## 2019-10-26 LAB — COMPREHENSIVE METABOLIC PANEL
ALT: 34 U/L (ref 0–44)
AST: 47 U/L — ABNORMAL HIGH (ref 15–41)
Albumin: 3.8 g/dL (ref 3.5–5.0)
Alkaline Phosphatase: 58 U/L (ref 38–126)
Anion gap: 9 (ref 5–15)
BUN: 19 mg/dL (ref 6–20)
CO2: 26 mmol/L (ref 22–32)
Calcium: 9.3 mg/dL (ref 8.9–10.3)
Chloride: 102 mmol/L (ref 98–111)
Creatinine, Ser: 0.96 mg/dL (ref 0.61–1.24)
GFR calc Af Amer: >60 mL/min (ref >60)
GFR calc non Af Amer: >60 mL/min (ref >60)
Glucose, Bld: 150 mg/dL — ABNORMAL HIGH (ref 70–99)
Potassium: 4.8 mmol/L (ref 3.5–5.1)
Sodium: 137 mmol/L (ref 135–145)
Total Bilirubin: 0.8 mg/dL (ref 0.3–1.2)
Total Protein: 7.5 g/dL (ref 6.5–8.1)

## 2019-10-26 LAB — FERRITIN: Ferritin: 323 ng/mL (ref 24–336)

## 2019-10-26 LAB — CBC
HCT: 45.2 % (ref 39.0–52.0)
Hemoglobin: 14.9 g/dL (ref 13.0–17.0)
MCH: 29.6 pg (ref 26.0–34.0)
MCHC: 33 g/dL (ref 30.0–36.0)
MCV: 89.7 fL (ref 80.0–100.0)
Platelets: 251 10*3/uL (ref 150–400)
RBC: 5.04 MIL/uL (ref 4.22–5.81)
RDW: 13 % (ref 11.5–15.5)
WBC: 8.9 10*3/uL (ref 4.0–10.5)
nRBC: 0 % (ref 0.0–0.2)

## 2019-10-26 LAB — BASIC METABOLIC PANEL
Anion gap: 9 (ref 5–15)
BUN: 19 mg/dL (ref 6–20)
CO2: 25 mmol/L (ref 22–32)
Calcium: 9.1 mg/dL (ref 8.9–10.3)
Chloride: 101 mmol/L (ref 98–111)
Creatinine, Ser: 0.96 mg/dL (ref 0.61–1.24)
GFR calc Af Amer: >60 mL/min (ref >60)
GFR calc non Af Amer: >60 mL/min (ref >60)
Glucose, Bld: 181 mg/dL — ABNORMAL HIGH (ref 70–99)
Potassium: 4.2 mmol/L (ref 3.5–5.1)
Sodium: 135 mmol/L (ref 135–145)

## 2019-10-26 LAB — LACTIC ACID, PLASMA
Lactic Acid, Venous: 2 mmol/L (ref 0.5–1.9)
Lactic Acid, Venous: 2 mmol/L (ref 0.5–1.9)

## 2019-10-26 LAB — TRIGLYCERIDES: Triglycerides: 159 mg/dL — ABNORMAL HIGH (ref <150)

## 2019-10-26 LAB — HEMOGLOBIN A1C
Hgb A1c MFr Bld: 7.8 % — ABNORMAL HIGH (ref 4.8–5.6)
Mean Plasma Glucose: 177.16 mg/dL

## 2019-10-26 LAB — FIBRINOGEN: Fibrinogen: >750 mg/dL — ABNORMAL HIGH (ref 210–475)

## 2019-10-26 LAB — GLUCOSE, CAPILLARY: Glucose-Capillary: 148 mg/dL — ABNORMAL HIGH (ref 70–99)

## 2019-10-26 LAB — TROPONIN I (HIGH SENSITIVITY)
Troponin I (High Sensitivity): 5 ng/L (ref <18)
Troponin I (High Sensitivity): 4 ng/L (ref <18)

## 2019-10-26 LAB — FIBRIN DERIVATIVES D-DIMER (ARMC ONLY): Fibrin derivatives D-dimer (ARMC): 1309.9 ng/mL (FEU) — ABNORMAL HIGH (ref 0.00–499.00)

## 2019-10-26 LAB — LACTATE DEHYDROGENASE: LDH: 200 U/L — ABNORMAL HIGH (ref 98–192)

## 2019-10-26 LAB — C-REACTIVE PROTEIN: CRP: 6.1 mg/dL — ABNORMAL HIGH (ref <1.0)

## 2019-10-26 MED ORDER — SODIUM CHLORIDE 0.9 % IV SOLN
250.0000 mL | INTRAVENOUS | Status: DC | PRN
  Administered 2019-10-27: 250 mL via INTRAVENOUS

## 2019-10-26 MED ORDER — INSULIN ASPART 100 UNIT/ML ~~LOC~~ SOLN
0.0000 [IU] | Freq: Three times a day (TID) | SUBCUTANEOUS | Status: DC
  Administered 2019-10-27: 16:00:00 7 [IU] via SUBCUTANEOUS
  Administered 2019-10-27: 4 [IU] via SUBCUTANEOUS
  Administered 2019-10-27 – 2019-10-28 (×2): 11 [IU] via SUBCUTANEOUS
  Administered 2019-10-28 – 2019-10-29 (×3): 20 [IU] via SUBCUTANEOUS
  Administered 2019-10-29: 15 [IU] via SUBCUTANEOUS
  Filled 2019-10-26 (×8): qty 1

## 2019-10-26 MED ORDER — INFLUENZA VAC SPLIT QUAD 0.5 ML IM SUSY: 0.5000 mL | PREFILLED_SYRINGE | INTRAMUSCULAR | Status: DC

## 2019-10-26 MED ORDER — SODIUM CHLORIDE 0.9 % IV SOLN
200.0000 mg | Freq: Once | INTRAVENOUS | Status: AC
  Administered 2019-10-26: 200 mg via INTRAVENOUS
  Filled 2019-10-26: qty 40
  Filled 2019-10-26: qty 200

## 2019-10-26 MED ORDER — ALBUTEROL SULFATE HFA 108 (90 BASE) MCG/ACT IN AERS
2.0000 | INHALATION_SPRAY | Freq: Once | RESPIRATORY_TRACT | Status: AC
  Administered 2019-10-26: 2 via RESPIRATORY_TRACT
  Filled 2019-10-26: qty 6.7

## 2019-10-26 MED ORDER — POLYETHYLENE GLYCOL 3350 17 G PO PACK: 17.0000 g | PACK | Freq: Every day | ORAL | Status: DC | PRN

## 2019-10-26 MED ORDER — PNEUMOCOCCAL VAC POLYVALENT 25 MCG/0.5ML IJ INJ: 0.5000 mL | INJECTION | INTRAMUSCULAR | Status: DC

## 2019-10-26 MED ORDER — IOHEXOL 350 MG/ML SOLN
75.0000 mL | Freq: Once | INTRAVENOUS | Status: AC | PRN
  Administered 2019-10-26: 75 mL via INTRAVENOUS

## 2019-10-26 MED ORDER — ACETAMINOPHEN 325 MG PO TABS: 650.0000 mg | ORAL_TABLET | Freq: Four times a day (QID) | ORAL | Status: DC | PRN

## 2019-10-26 MED ORDER — ENOXAPARIN SODIUM 120 MG/0.8ML ~~LOC~~ SOLN
1.0000 mg/kg | Freq: Two times a day (BID) | SUBCUTANEOUS | Status: DC
  Administered 2019-10-26 – 2019-10-27 (×2): 110 mg via SUBCUTANEOUS
  Filled 2019-10-26 (×3): qty 0.8

## 2019-10-26 MED ORDER — SODIUM CHLORIDE 0.9 % IV SOLN
100.0000 mg | Freq: Every day | INTRAVENOUS | Status: DC
  Administered 2019-10-27 – 2019-10-29 (×3): 100 mg via INTRAVENOUS
  Filled 2019-10-26 (×4): qty 20

## 2019-10-26 MED ORDER — SODIUM CHLORIDE 0.9% FLUSH: 3.0000 mL | INTRAVENOUS | Status: DC | PRN

## 2019-10-26 MED ORDER — DEXAMETHASONE 4 MG PO TABS
6.0000 mg | ORAL_TABLET | ORAL | Status: DC
  Administered 2019-10-26: 21:00:00 6 mg via ORAL
  Filled 2019-10-26: qty 2

## 2019-10-26 MED ORDER — INSULIN ASPART 100 UNIT/ML ~~LOC~~ SOLN: 0.0000 [IU] | Freq: Every day | SUBCUTANEOUS | Status: DC

## 2019-10-26 MED ORDER — SODIUM CHLORIDE 0.9 % IV SOLN
1000.0000 mL | INTRAVENOUS | Status: DC
  Administered 2019-10-26: 13:00:00 1000 mL via INTRAVENOUS

## 2019-10-26 MED ORDER — SODIUM CHLORIDE 0.9% FLUSH
3.0000 mL | Freq: Two times a day (BID) | INTRAVENOUS | Status: DC
  Administered 2019-10-27 – 2019-10-29 (×5): 3 mL via INTRAVENOUS

## 2019-10-26 NOTE — ED Triage Notes (Signed)
Patient presents to the ED with chest pain and shortness of breath.  Patient was recently diagnosed with [phone].  Patient states, "today is the last day I'm contagious with covid but I'm still having breathing problems so my wife told me I need to come back."  Patient reports diagnosis of pneumonia.

## 2019-10-26 NOTE — ED Notes (Signed)
Pt removed from monitor to be allowed to urinate. Pt denies weakness or dizziness at this time.

## 2019-10-26 NOTE — ED Notes (Signed)
Called pharmacy to check on remdisivir as it has not been received by this RN, pharmacy to bring med to this RN once it is ready

## 2019-10-26 NOTE — ED Notes (Signed)
Report given to Chris, RN

## 2019-10-26 NOTE — ED Notes (Signed)
Pt given cola per request.

## 2019-10-26 NOTE — ED Provider Notes (Signed)
[facility] Emergency Department Provider Note    First MD Initiated Contact with Patient 10/26/19 1204     (approximate)  I have reviewed the triage vital signs and the nursing notes.   HISTORY  Chief Complaint Chest Pain    HPI Christopher Dennis is a 54 y.o. male with the below listed past medical history presents to the ER for evaluation of progressively worsening shortness of breath weakness and exertional dyspnea.  Patient was just recently diagnosed with COVID-19 on the 27th this past month.   Seen in the ER just recently with reassuring work-up and discharged home with symptomatic management as well as azithromycin.  Patient feels like his symptoms are worsening.  Had not been given referral received [facility] order remdesivir as an outpatient.   Past Medical History:  Diagnosis Date  . Abnormal LFTs 12/04/2012  . Arteriosclerosis of coronary artery 12/04/2012  . Back ache 12/04/2012  . BP (high blood pressure) 12/04/2012  . Clinical depression 12/04/2012  . DDD (degenerative disc disease), lumbosacral 12/11/2012  . Diabetes ([place])   . Displacement of lumbar intervertebral disc without myelopathy 12/11/2012  . HLD (hyperlipidemia) 12/04/2012  . Lumbar canal stenosis 12/11/2012  . Neuropathy 12/04/2012  . Sleep apnea    CPAP - hasn't been using, no sure if it works  . Thoracic and lumbosacral neuritis 12/11/2012   Family History  Problem Relation Age of Onset  . Diabetes Mother   . COPD Mother   . Alcohol abuse Father   . Asthma Sister    Past Surgical History:  Procedure Laterality Date  . BACK SURGERY  2000  . COLONOSCOPY WITH PROPOFOL N/A 05/12/2015   Procedure: COLONOSCOPY WITH PROPOFOL;  Surgeon: Lucilla Lame, MD;  Location: [place];  Service: Endoscopy;  Laterality: N/A;  . CORONARY ANGIOPLASTY WITH STENT PLACEMENT  06/17/2011  . ESOPHAGOGASTRODUODENOSCOPY (EGD) WITH PROPOFOL N/A 05/12/2015   Procedure:  ESOPHAGOGASTRODUODENOSCOPY (EGD) WITH PROPOFOL;  Surgeon: Lucilla Lame, MD;  Location: [place];  Service: Endoscopy;  Laterality: N/A;  CPAP  . KNEE ARTHROSCOPY Right 09/19/2014  . Laminectomy posterior cervicle decomp    . laminectomy posterior lumbar facetectomy & Foraminotomy w/decomp  12/15/2012  . meniscus tear  2006  . partial medial and extensive lateral meniscectomy    . POLYPECTOMY  05/12/2015   Procedure: POLYPECTOMY;  Surgeon: Lucilla Lame, MD;  Location: [place];  Service: Endoscopy;;   Patient Active Problem List   Diagnosis Date Noted  . Uncontrolled type 2 diabetes mellitus with hyperglycemia, without long-term current use of insulin ([place]) 03/02/2018  . Type 2 diabetes mellitus ([place]) 03/02/2018  . Trouble swallowing   . Gastritis   . Stricture and stenosis of esophagus   . Special screening for malignant neoplasms, colon   . Benign neoplasm of sigmoid colon   . Fourth degree hemorrhoids   . DDD (degenerative disc disease), lumbosacral 12/11/2012  . Displacement of lumbar intervertebral disc without myelopathy 12/11/2012  . Thoracic or lumbosacral neuritis or radiculitis, unspecified 12/11/2012  . Spinal stenosis of lumbar region without neurogenic claudication 12/11/2012  . Back pain 12/04/2012  . CAD (coronary artery disease) 12/04/2012  . Clinical depression 12/04/2012  . Diabetes ([place]) 12/04/2012  . Elevated liver function tests 12/04/2012  . HTN (hypertension) 12/04/2012  . Hyperlipidemia 12/04/2012  . Hyponatremia 12/04/2012  . Seizure-like activity ([place]) 12/04/2012  . Neuropathy 12/04/2012  . Diabetes mellitus type 2, uncomplicated ([employer]) [id number]      Prior to Admission  medications   Medication Sig Start Date End Date Taking? Authorizing Provider  albuterol (VENTOLIN HFA) 108 (90 Base) MCG/ACT inhaler Inhale 2 puffs into the lungs every 6 (six) hours as needed for wheezing or shortness of breath. 10/22/19   Laban Emperor, PA-C    azithromycin (ZITHROMAX Z-PAK) 250 MG tablet Take 2 tablets (500 mg) on  Day 1,  followed by 1 tablet (250 mg) once daily on Days 2 through 5. 10/22/19   Laban Emperor, PA-C  clopidogrel (PLAVIX) 75 MG tablet Take 75 mg by mouth daily.    [provider]  empagliflozin (JARDIANCE) 25 MG TABS tablet TAKE 1 TABLET BY MOUTH EVERY DAY 12/26/17   [provider]  Insulin Glargine (LANTUS SOLOSTAR) 100 UNIT/ML Solostar Pen Inject into the skin.    [provider]  Insulin Pen Needle (BD PEN NEEDLE NANO U/F) 32G X 4 MM MISC USE ONCE DAILY 10/31/17   [provider]  lisinopril (PRINIVIL,ZESTRIL) 20 MG tablet Take by mouth. 03/02/18   [provider]  Loratadine 10 MG CAPS Take by mouth. 03/02/18   [provider]  metFORMIN (GLUCOPHAGE) 1000 MG tablet Take by mouth. 03/02/18   [provider]  ONE TOUCH ULTRA TEST test strip TEST AT LEAST TID 10/05/16   Caryn Section Linden Dolin, PA-C  pioglitazone (ACTOS) 30 MG tablet Take by mouth. 07/05/18 07/05/19  [provider]  ranitidine (ZANTAC) 150 MG tablet Take by mouth. 03/02/18   [provider]  simvastatin (ZOCOR) 40 MG tablet  02/02/16   [provider]  Vitamin D, Ergocalciferol, (DRISDOL) 1.25 MG (50000 UT) CAPS capsule Take by mouth. 06/01/17   [provider]    Allergies Patient has no known allergies.    Social History Social History   Tobacco Use  . Smoking status: Former Smoker    Quit date: 01/21/2009    Years since quitting: 10.7  . Smokeless tobacco: Current User    Types: Snuff  Substance Use Topics  . Alcohol use: Yes    Alcohol/week: 0.0 standard drinks    Comment: rarely - 1 beer/month  . Drug use: No    Review of Systems Patient denies headaches, rhinorrhea, blurry vision, numbness, shortness of breath, chest pain, edema, cough, abdominal pain, nausea, vomiting, diarrhea, dysuria, fevers, rashes or hallucinations unless otherwise stated above  in HPI. ____________________________________________   PHYSICAL EXAM:  VITAL SIGNS: Vitals:   10/26/19 1116 10/26/19 1230  BP: 121/81 116/80  Pulse: (!) 115 95  Resp: (!) 24 (!) 22  Temp: 97.9 F (36.6 C)   SpO2: 97% 94%    Constitutional: Alert and oriented.  Eyes: Conjunctivae are normal.  Head: Atraumatic. Nose: No congestion/rhinnorhea. Mouth/Throat: Mucous membranes are moist.   Neck: No stridor. Painless ROM.  Cardiovascular: Normal rate, regular rhythm. Grossly normal heart sounds.  Good peripheral circulation. Respiratory: Normal respiratory effort.  No retractions. Lungs with scattered crackles throughout. Gastrointestinal: Soft and nontender. No distention. No abdominal bruits. No CVA tenderness. Genitourinary:  Musculoskeletal: No lower extremity tenderness nor edema.  No joint effusions. Neurologic:  Normal speech and language. No gross focal neurologic deficits are appreciated. No facial droop Skin:  Skin is warm, dry and intact. No rash noted. Psychiatric: Mood and affect are normal. Speech and behavior are normal.  ____________________________________________   LABS (all labs ordered are listed, but only abnormal results are displayed)  Results for orders placed or performed during the hospital encounter of 10/26/19 (from the past 24 hour(s))  Basic  metabolic panel     Status: Abnormal   Collection Time: 10/26/19 11:15 AM  Result Value Ref Range   Sodium 135 135 - 145 mmol/L   Potassium 4.2 3.5 - 5.1 mmol/L   Chloride 101 98 - 111 mmol/L   CO2 25 22 - 32 mmol/L   Glucose, Bld 181 (H) 70 - 99 mg/dL   BUN 19 6 - 20 mg/dL   Creatinine, Ser 0.96 0.61 - 1.24 mg/dL   Calcium 9.1 8.9 - 10.3 mg/dL   GFR calc non Af Amer >60 >60 mL/min   GFR calc Af Amer >60 >60 mL/min   Anion gap 9 5 - 15  CBC     Status: None   Collection Time: 10/26/19 11:15 AM  Result Value Ref Range   WBC 8.9 4.0 - 10.5 K/uL   RBC 5.04 4.22 - 5.81 MIL/uL   Hemoglobin 14.9 13.0 -  17.0 g/dL   HCT 45.2 39.0 - 52.0 %   MCV 89.7 80.0 - 100.0 fL   MCH 29.6 26.0 - 34.0 pg   MCHC 33.0 30.0 - 36.0 g/dL   RDW 13.0 11.5 - 15.5 %   Platelets 251 150 - 400 K/uL   nRBC 0.0 0.0 - 0.2 %  Troponin I (High Sensitivity)     Status: None   Collection Time: 10/26/19 11:15 AM  Result Value Ref Range   Troponin I (High Sensitivity) 5 <18 ng/L  Triglycerides     Status: Abnormal   Collection Time: 10/26/19 12:38 PM  Result Value Ref Range   Triglycerides 159 (H) <150 mg/dL  Lactic acid, plasma     Status: Abnormal   Collection Time: 10/26/19 12:46 PM  Result Value Ref Range   Lactic Acid, Venous 2.0 (HH) 0.5 - 1.9 mmol/L  Comprehensive metabolic panel     Status: Abnormal   Collection Time: 10/26/19 12:46 PM  Result Value Ref Range   Sodium 137 135 - 145 mmol/L   Potassium 4.8 3.5 - 5.1 mmol/L   Chloride 102 98 - 111 mmol/L   CO2 26 22 - 32 mmol/L   Glucose, Bld 150 (H) 70 - 99 mg/dL   BUN 19 6 - 20 mg/dL   Creatinine, Ser 0.96 0.61 - 1.24 mg/dL   Calcium 9.3 8.9 - 10.3 mg/dL   Total Protein 7.5 6.5 - 8.1 g/dL   Albumin 3.8 3.5 - 5.0 g/dL   AST 47 (H) 15 - 41 U/L   ALT 34 0 - 44 U/L   Alkaline Phosphatase 58 38 - 126 U/L   Total Bilirubin 0.8 0.3 - 1.2 mg/dL   GFR calc non Af Amer >60 >60 mL/min   GFR calc Af Amer >60 >60 mL/min   Anion gap 9 5 - 15  Fibrin derivatives D-Dimer     Status: Abnormal   Collection Time: 10/26/19 12:46 PM  Result Value Ref Range   Fibrin derivatives D-dimer ([facility]) 1,309.90 (H) 0.00 - 499.00 ng/mL (FEU)  Procalcitonin     Status: None   Collection Time: 10/26/19 12:46 PM  Result Value Ref Range   Procalcitonin <0.10 ng/mL  Lactate dehydrogenase     Status: Abnormal   Collection Time: 10/26/19 12:46 PM  Result Value Ref Range   LDH 200 (H) 98 - 192 U/L  Ferritin     Status: None   Collection Time: 10/26/19 12:46 PM  Result Value Ref Range   Ferritin 323 24 - 336 ng/mL  Fibrinogen     Status:  Abnormal   Collection Time: 10/26/19  12:46 PM  Result Value Ref Range   Fibrinogen >750 (H) 210 - 475 mg/dL  Troponin I (High Sensitivity)     Status: None   Collection Time: 10/26/19 12:46 PM  Result Value Ref Range   Troponin I (High Sensitivity) 4 <18 ng/L   ____________________________________________  EKG My review and personal interpretation at Time: 15:24   Indication: sob  Rate: 85  Rhythm: sinus Axis: normal Other: normal intervals, no stemi ____________________________________________  RADIOLOGY   I personally reviewed all radiographic images ordered to evaluate for the above acute complaints and reviewed radiology reports and findings.  These findings were personally discussed with the patient.  Please see medical record for radiology report.  ____________________________________________   PROCEDURES  Procedure(s) performed:  Procedures    Critical Care performed: no ____________________________________________   INITIAL IMPRESSION / ASSESSMENT AND PLAN / ED COURSE  Pertinent labs & imaging results that were available during my care of the patient were reviewed by me and considered in my medical decision making (see chart for details).   DDX: Asthma, copd, CHF, pna, ptx, malignancy, Pe, anemia, covid  Christopher Dennis is a 54 y.o. who presents to the ED with symptoms as described above.  Patient ill-appearing no respiratory distress at this time but does have respiratory symptoms and crackles throughout.  Presentation is concerning consistent with worsening COVID-19 infection.  Will order blood work symptomatic management here in the ER.  Clinical Course as of Oct 26 1527  Fri Oct 26, 2019  1526 Patient with no hypoxia but significant tachypnea and tachycardia with ambulation.  Given his declining clinical status will discuss with hospitalist for admission.   [PR]    Clinical Course User Index [PR] Merlyn Lot, MD    The patient was evaluated in Emergency Department today for the  symptoms described in the history of present illness. He/she was evaluated in the context of the global COVID-19 pandemic, which necessitated consideration that the patient might be at risk for infection with the SARS-CoV-2 virus that causes COVID-19. Institutional protocols and algorithms that pertain to the evaluation of patients at risk for COVID-19 are in a state of rapid change based on information released by regulatory bodies including the CDC and federal and state organizations. These policies and algorithms were followed during the patient's care in the ED.  As part of my medical decision making, I reviewed the following data within the [facility] notes reviewed and incorporated, Labs reviewed, notes from prior ED visits and [place] Controlled Substance Database   ____________________________________________   FINAL CLINICAL IMPRESSION(S) / ED DIAGNOSES  Final diagnoses:  COVID-19 virus infection  Shortness of breath      NEW MEDICATIONS STARTED DURING THIS VISIT:  New Prescriptions   No medications on file     Note:  This document was prepared using Dragon voice recognition software and may include unintentional dictation errors.    Merlyn Lot, MD 10/26/19 [phone]

## 2019-10-26 NOTE — Progress Notes (Signed)
Remdesivir - Pharmacy Brief Note   O:  ALT: 34 CXR: No change in patchy bilateral airspace disease consistent with pneumonia SpO2: 97% on RA   A/P:  Remdesivir 200 mg IVPB once followed by 100 mg IVPB daily x 4 days.   Dorena Bodo, PharmD 10/26/2019 3:35 PM

## 2019-10-26 NOTE — Progress Notes (Signed)
ANTICOAGULATION CONSULT NOTE - Initial Consult  Pharmacy Consult for Enoxaparin Indication: VTE prophylaxis  No Known Allergies  Patient Measurements: Height: 6' (182.9 cm) Weight: 240 lb (108.9 kg) IBW/kg (Calculated) : 77.6 Heparin Dosing Weight:    Vital Signs: Temp: 98.4 F (36.9 C) (03/05 1957) Temp Source: Oral (03/05 1957) BP: 116/86 (03/05 1957) Pulse Rate: 96 (03/05 1957)  Labs: Recent Labs    10/26/19 1115 10/26/19 1246  HGB 14.9  --   HCT 45.2  --   PLT 251  --   CREATININE 0.96 0.96  TROPONINIHS 5 4    Estimated Creatinine Clearance: 109.5 mL/min (by C-G formula based on SCr of 0.96 mg/dL).   Medical History: Past Medical History:  Diagnosis Date  . Abnormal LFTs 12/04/2012  . Arteriosclerosis of coronary artery 12/04/2012  . Back ache 12/04/2012  . BP (high blood pressure) 12/04/2012  . Clinical depression 12/04/2012  . DDD (degenerative disc disease), lumbosacral 12/11/2012  . Diabetes ([place])   . Displacement of lumbar intervertebral disc without myelopathy 12/11/2012  . HLD (hyperlipidemia) 12/04/2012  . Lumbar canal stenosis 12/11/2012  . Neuropathy 12/04/2012  . Sleep apnea    CPAP - hasn't been using, no sure if it works  . Thoracic and lumbosacral neuritis 12/11/2012   Assessment: Patient is a 54yo male admitted for Covid. Pharmacy consulted for Enoxaparin dosing, MD wants full dose Enoxaparin.   Plan:  Will order Enoxaparin 1mg /kg SQ q12h for now. Will follow for need to continue full dose therapy. Monitor CBC/SCr q3 days.  Paulina Fusi, PharmD, BCPS 10/26/2019 9:12 PM

## 2019-10-26 NOTE — H&P (Addendum)
History and Physical:    Christopher Dennis   [id number] DOB: 02-11-1966 DOA: 10/26/2019  Referring MD/provider: Dr. Quentin Cornwall PCP: Department, [facility]   Patient coming from: Home  Chief Complaint: Weakness from Covid  History of Present Illness:   Christopher Dennis is an 54 y.o. male with PMH significant for HTN, DM 2, HL and CAD status post stenting with no history of MI was diagnosed with Covid 10/20/2019 (can be found under care [facility]).  Patient was seen in the ED earlier this week with reassuring work-up.  He was discharged home on azithromycin.  He states he was doing okay until 2 days ago when he had significant increase in fatigue and shortness of breath.  Patient states that he was so short of breath just walking from his bedroom to his bathroom that he thought he was going to pass out today so came to ED.  Patient admits to fevers and chills at home.  Biggest concern is profound fatigue and then shortness of breath which developed 2 days ago.  Admits to dry cough which is gotten worse over the past couple of days.  No hemoptysis.  No phlegm.  Patient denies orthopnea or PND.  No chest pain.  Patient admits to decreased p.o. intake secondary to anorexia.  No nausea or vomiting.  He does have some diarrhea that is not too bad.   ED Course:  The patient was noted to be ill-appearing with crackles throughout his lungs.  He was not hypoxic at rest and even with ambulation he did not drop his O2 sats however he was significantly tachycardic and tachypneic.  He had an elevated FSP.  CT angiogram done revealed no PE however he did have extensive infiltrates consistent with Covid 19 pneumonia.  ROS:   ROS   Review of Systems: As per HPI   Past Medical History:   Past Medical History:  Diagnosis Date  . Abnormal LFTs 12/04/2012  . Arteriosclerosis of coronary artery 12/04/2012  . Back ache 12/04/2012  . BP (high blood  pressure) 12/04/2012  . Clinical depression 12/04/2012  . DDD (degenerative disc disease), lumbosacral 12/11/2012  . Diabetes ([place])   . Displacement of lumbar intervertebral disc without myelopathy 12/11/2012  . HLD (hyperlipidemia) 12/04/2012  . Lumbar canal stenosis 12/11/2012  . Neuropathy 12/04/2012  . Sleep apnea    CPAP - hasn't been using, no sure if it works  . Thoracic and lumbosacral neuritis 12/11/2012    Past Surgical History:   Past Surgical History:  Procedure Laterality Date  . BACK SURGERY  2000  . COLONOSCOPY WITH PROPOFOL N/A 05/12/2015   Procedure: COLONOSCOPY WITH PROPOFOL;  Surgeon: Lucilla Lame, MD;  Location: [place];  Service: Endoscopy;  Laterality: N/A;  . CORONARY ANGIOPLASTY WITH STENT PLACEMENT  06/17/2011  . ESOPHAGOGASTRODUODENOSCOPY (EGD) WITH PROPOFOL N/A 05/12/2015   Procedure: ESOPHAGOGASTRODUODENOSCOPY (EGD) WITH PROPOFOL;  Surgeon: Lucilla Lame, MD;  Location: [place];  Service: Endoscopy;  Laterality: N/A;  CPAP  . KNEE ARTHROSCOPY Right 09/19/2014  . Laminectomy posterior cervicle decomp    . laminectomy posterior lumbar facetectomy & Foraminotomy w/decomp  12/15/2012  . meniscus tear  2006  . partial medial and extensive lateral meniscectomy    . POLYPECTOMY  05/12/2015   Procedure: POLYPECTOMY;  Surgeon: Lucilla Lame, MD;  Location: [place];  Service: Endoscopy;;    Social History:   Social History   Socioeconomic History  . Marital status:  Married    Spouse name: Not on file  . Number of children: Not on file  . Years of education: Not on file  . Highest education level: Not on file  Occupational History  . Not on file  Tobacco Use  . Smoking status: Former Smoker    Quit date: 01/21/2009    Years since quitting: 10.7  . Smokeless tobacco: Current User    Types: Snuff  Substance and Sexual Activity  . Alcohol use: Yes    Alcohol/week: 0.0 standard drinks    Comment: rarely - 1 beer/month  . Drug use: No   . Sexual activity: Not on file  Other Topics Concern  . Not on file  Social History Narrative  . Not on file   Social Determinants of Health   Financial Resource Strain:   . Difficulty of Paying Living Expenses: Not on file  Food Insecurity:   . Worried About Charity fundraiser in the Last Year: Not on file  . Ran Out of Food in the Last Year: Not on file  Transportation Needs:   . Lack of Transportation (Medical): Not on file  . Lack of Transportation (Non-Medical): Not on file  Physical Activity:   . Days of Exercise per Week: Not on file  . Minutes of Exercise per Session: Not on file  Stress:   . Feeling of Stress : Not on file  Social Connections:   . Frequency of Communication with Friends and Family: Not on file  . Frequency of Social Gatherings with Friends and Family: Not on file  . Attends Religious Services: Not on file  . Active Member of Clubs or Organizations: Not on file  . Attends Archivist Meetings: Not on file  . Marital Status: Not on file  Intimate Partner Violence:   . Fear of Current or Ex-Partner: Not on file  . Emotionally Abused: Not on file  . Physically Abused: Not on file  . Sexually Abused: Not on file    Allergies   Patient has no known allergies.  Family history:   Family History  Problem Relation Age of Onset  . Diabetes Mother   . COPD Mother   . Alcohol abuse Father   . Asthma Sister     Current Medications:   Prior to Admission medications   Medication Sig Start Date End Date Taking? Authorizing Provider  albuterol (VENTOLIN HFA) 108 (90 Base) MCG/ACT inhaler Inhale 2 puffs into the lungs every 6 (six) hours as needed for wheezing or shortness of breath. 10/22/19   Laban Emperor, PA-C  azithromycin (ZITHROMAX Z-PAK) 250 MG tablet Take 2 tablets (500 mg) on  Day 1,  followed by 1 tablet (250 mg) once daily on Days 2 through 5. 10/22/19   Laban Emperor, PA-C  clopidogrel (PLAVIX) 75 MG tablet Take 75 mg by mouth  daily.    [provider]  empagliflozin (JARDIANCE) 25 MG TABS tablet TAKE 1 TABLET BY MOUTH EVERY DAY 12/26/17   [provider]  Insulin Glargine (LANTUS SOLOSTAR) 100 UNIT/ML Solostar Pen Inject into the skin.    [provider]  Insulin Pen Needle (BD PEN NEEDLE NANO U/F) 32G X 4 MM MISC USE ONCE DAILY 10/31/17   [provider]  lisinopril (PRINIVIL,ZESTRIL) 20 MG tablet Take by mouth. 03/02/18   [provider]  Loratadine 10 MG CAPS Take by mouth. 03/02/18   [provider]  metFORMIN (GLUCOPHAGE) 1000 MG tablet Take by mouth. 03/02/18  [provider]  ONE TOUCH ULTRA TEST test strip TEST AT LEAST TID 10/05/16   Caryn Section Linden Dolin, PA-C  pioglitazone (ACTOS) 30 MG tablet Take by mouth. 07/05/18 07/05/19  [provider]  ranitidine (ZANTAC) 150 MG tablet Take by mouth. 03/02/18   [provider]  simvastatin (ZOCOR) 40 MG tablet  02/02/16   [provider]  Vitamin D, Ergocalciferol, (DRISDOL) 1.25 MG (50000 UT) CAPS capsule Take by mouth. 06/01/17   [provider]    Physical Exam:   Vitals:   10/26/19 1330 10/26/19 1430 10/26/19 1530 10/26/19 1700  BP: 108/73 106/72 107/69 (!) 95/59  Pulse: 97 84 81 (!) 110  Resp: 19 (!) 22 (!) 25 (!) 21  Temp:      TempSrc:      SpO2: 95% 95% 94% 96%  Weight:      Height:         Physical Exam: Blood pressure (!) 95/59, pulse (!) 110, temperature 97.9 F (36.6 C), temperature source Oral, resp. rate (!) 21, height 6' (1.829 m), weight 108.9 kg, SpO2 96 %. Gen: Obese man lying in bed appearing tired with elevated respiratory rate but no labored breathing. Eyes: sclera anicteric, conjuctiva clear, no lid lag, no exophthalmos, EOMI CVS: S1-S2 clear, tachycardic, 2/6 systolic murmur.   Respiratory:  normal effort, symmetrical excursion, worse breath sounds throughout with rales at bases. GI: NABS, soft, NT,  LE: No edema. No cyanosis Neuro: A/O x 3,  Moving all extremities equally with normal strength, CN 3-12 intact, grossly nonfocal.  Psych: patient is logical and coherent, judgement and insight appear normal, mood and affect appropriate to situation. Skin: no rashes or lesions or ulcers,    Data Review:    Labs: Basic Metabolic Panel: Recent Labs  Lab 10/22/19 1626 10/26/19 1115 10/26/19 1246  NA 134* 135 137  K 4.5 4.2 4.8  CL 101 101 102  CO2 23 25 26   GLUCOSE 111* 181* 150*  BUN 20 19 19   CREATININE 0.98 0.96 0.96  CALCIUM 8.5* 9.1 9.3   Liver Function Tests: Recent Labs  Lab 10/26/19 1246  AST 47*  ALT 34  ALKPHOS 58  BILITOT 0.8  PROT 7.5  ALBUMIN 3.8   No results for input(s): LIPASE, AMYLASE in the last 168 hours. No results for input(s): AMMONIA in the last 168 hours. CBC: Recent Labs  Lab 10/22/19 1514 10/26/19 1115  WBC 6.3 8.9  HGB 14.7 14.9  HCT 44.2 45.2  MCV 89.5 89.7  PLT 162 251   Cardiac Enzymes: No results for input(s): CKTOTAL, CKMB, CKMBINDEX, TROPONINI in the last 168 hours.  BNP (last 3 results) No results for input(s): PROBNP in the last 8760 hours. CBG: No results for input(s): GLUCAP in the last 168 hours.  Urinalysis No results found for: COLORURINE, APPEARANCEUR, LABSPEC, PHURINE, GLUCOSEU, HGBUR, BILIRUBINUR, KETONESUR, PROTEINUR, UROBILINOGEN, NITRITE, LEUKOCYTESUR    Radiographic Studies: DG Chest 2 View  Result Date: 10/26/2019 CLINICAL DATA:  Chest pain and shortness of breath in a patient who is COVID-19 positive. EXAM: CHEST - 2 VIEW COMPARISON:  PA and lateral chest 10/22/2019. FINDINGS: Patchy bilateral airspace disease is unchanged. Heart size is normal. No pneumothorax or pleural effusion. No acute or focal bony abnormality. IMPRESSION: No change in patchy bilateral airspace disease consistent with pneumonia. Electronically Signed   By: Inge Rise M.D.   On: 10/26/2019 11:44   CT Angio Chest PE W and/or Wo Contrast  Result Date: 10/26/2019 CLINICAL  DATA:  Worsening shortness of breath, weakness, dyspnea, recent COVID-19 diagnosis EXAM: CT ANGIOGRAPHY CHEST WITH CONTRAST TECHNIQUE: Multidetector CT imaging of the chest was performed using the standard protocol during bolus administration of intravenous contrast. Multiplanar CT image reconstructions and MIPs were obtained to evaluate the vascular anatomy. CONTRAST:  46mL OMNIPAQUE IOHEXOL 350 MG/ML SOLN COMPARISON:  Chest radiographs, 10/26/2019, 10/22/2019, CT chest, 06/08/2011 FINDINGS: Cardiovascular: Satisfactory opacification of the pulmonary arteries to the segmental level. No evidence of pulmonary embolism. Normal heart size. Three-vessel coronary artery calcifications and/or stents. No pericardial effusion. Aortic atherosclerosis. Mediastinum/Nodes: No enlarged mediastinal, hilar, or axillary lymph nodes. Thyroid gland, trachea, and esophagus demonstrate no significant findings. Lungs/Pleura: There is extensive bilateral peripheral heterogeneous and ground-glass airspace opacity. No pleural effusion or pneumothorax. Upper Abdomen: No acute abnormality. Musculoskeletal: No chest wall abnormality. No acute or significant osseous findings. Review of the MIP images confirms the above findings. IMPRESSION: 1.  Negative examination for pulmonary embolism. 2. Extensive bilateral peripheral heterogeneous and ground-glass airspace opacity, in keeping with COVID airspace disease. 3.  Coronary artery disease.  Aortic Atherosclerosis (ICD10-I70.0). Electronically Signed   By: Eddie Candle M.D.   On: 10/26/2019 16:11    EKG: Independently reviewed.  Sinus rhythm at 90.  T wave inversion in 3.  Early transition of R wave.  No acute ST-T wave changes.   Assessment/Plan:   Principal Problem:   Pneumonia due to COVID-19 virus Active Problems:   CAD (coronary artery disease)   HTN (hypertension)   Hyperlipidemia   Diabetes mellitus type 2, uncomplicated ([place])  54 year old male with recent diagnosis of  Covid 19 now presents with COVID-19 pneumonia, not yet hypoxic.  COVID-19 pneumonia Patient clearly has pneumonia by chest imaging however is not yet hypoxic. He has tachypnea with minimal exertion. Will treat with oxygen, remdesivir and steroids. Will need to follow patient closely, continuous pulse oximeter requested Can consider Actemra if warranted, patient's LFTs are normal here but does have a history of "elevated LFTs" on problem list possibly secondary to fatty infiltration.  Patient denies any history of hepatitis B, C or TB.  HTN Patient is presently hypotensive, will need to hold all antihypertensives  DM2 Patient's medicines have not yet been reconciled Will place patient on CBG AC at bedtime with SSI coverage, high/resistant dose Would start patient back on his glargine once we have an accurate dose after medicines are reconciled. Carb modified diet  CAD No evidence for acute event Would start patient back on his antiplatelet and other cardiac meds once they have been verified and reconciled   Other information:   DVT prophylaxis: Full dose Lovenox ordered. Code Status: Full Family Communication: Patient states his family knows he is here Disposition Plan: Home Consults called: None Admission status: Inpatient  [facility]  If 7PM-7AM, please contact night-coverage [email or web address] Password [email or web address] 10/26/2019, 6:50 PM

## 2019-10-27 LAB — COMPREHENSIVE METABOLIC PANEL
ALT: 28 U/L (ref 0–44)
AST: 33 U/L (ref 15–41)
Albumin: 3.4 g/dL — ABNORMAL LOW (ref 3.5–5.0)
Alkaline Phosphatase: 54 U/L (ref 38–126)
Anion gap: 11 (ref 5–15)
BUN: 20 mg/dL (ref 6–20)
CO2: 25 mmol/L (ref 22–32)
Calcium: 9.2 mg/dL (ref 8.9–10.3)
Chloride: 101 mmol/L (ref 98–111)
Creatinine, Ser: 0.83 mg/dL (ref 0.61–1.24)
GFR calc Af Amer: >60 mL/min (ref >60)
GFR calc non Af Amer: >60 mL/min (ref >60)
Glucose, Bld: 192 mg/dL — ABNORMAL HIGH (ref 70–99)
Potassium: 5.2 mmol/L — ABNORMAL HIGH (ref 3.5–5.1)
Sodium: 137 mmol/L (ref 135–145)
Total Bilirubin: 0.6 mg/dL (ref 0.3–1.2)
Total Protein: 7.1 g/dL (ref 6.5–8.1)

## 2019-10-27 LAB — CBC WITH DIFFERENTIAL/PLATELET
Abs Immature Granulocytes: 0.21 10*3/uL — ABNORMAL HIGH (ref 0.00–0.07)
Basophils Absolute: 0 10*3/uL (ref 0.0–0.1)
Basophils Relative: 1 %
Eosinophils Absolute: 0 10*3/uL (ref 0.0–0.5)
Eosinophils Relative: 0 %
HCT: 42.2 % (ref 39.0–52.0)
Hemoglobin: 13.6 g/dL (ref 13.0–17.0)
Immature Granulocytes: 5 %
Lymphocytes Relative: 16 %
Lymphs Abs: 0.8 10*3/uL (ref 0.7–4.0)
MCH: 29.4 pg (ref 26.0–34.0)
MCHC: 32.2 g/dL (ref 30.0–36.0)
MCV: 91.3 fL (ref 80.0–100.0)
Monocytes Absolute: 0.1 10*3/uL (ref 0.1–1.0)
Monocytes Relative: 2 %
Neutro Abs: 3.6 10*3/uL (ref 1.7–7.7)
Neutrophils Relative %: 76 %
Platelets: 256 10*3/uL (ref 150–400)
RBC: 4.62 MIL/uL (ref 4.22–5.81)
RDW: 13.1 % (ref 11.5–15.5)
WBC: 4.7 10*3/uL (ref 4.0–10.5)
nRBC: 0 % (ref 0.0–0.2)

## 2019-10-27 LAB — GLUCOSE, CAPILLARY
Glucose-Capillary: 181 mg/dL — ABNORMAL HIGH (ref 70–99)
Glucose-Capillary: 286 mg/dL — ABNORMAL HIGH (ref 70–99)
Glucose-Capillary: 224 mg/dL — ABNORMAL HIGH (ref 70–99)
Glucose-Capillary: 318 mg/dL — ABNORMAL HIGH (ref 70–99)

## 2019-10-27 LAB — FIBRIN DERIVATIVES D-DIMER (ARMC ONLY): Fibrin derivatives D-dimer (ARMC): 1168.67 ng/mL (FEU) — ABNORMAL HIGH (ref 0.00–499.00)

## 2019-10-27 LAB — C-REACTIVE PROTEIN: CRP: 9.8 mg/dL — ABNORMAL HIGH (ref <1.0)

## 2019-10-27 LAB — ABO/RH: ABO/RH(D): A POS

## 2019-10-27 LAB — HIV ANTIBODY (ROUTINE TESTING W REFLEX): HIV Screen 4th Generation wRfx: NONREACTIVE

## 2019-10-27 MED ORDER — GUAIFENESIN-DM 100-10 MG/5ML PO SYRP
5.0000 mL | ORAL_SOLUTION | ORAL | Status: DC | PRN
  Administered 2019-10-27: 21:00:00 5 mL via ORAL
  Filled 2019-10-27: qty 5

## 2019-10-27 MED ORDER — CLOPIDOGREL BISULFATE 75 MG PO TABS
75.0000 mg | ORAL_TABLET | Freq: Every day | ORAL | Status: DC
  Administered 2019-10-27 – 2019-10-28 (×2): 75 mg via ORAL
  Filled 2019-10-27 (×2): qty 1

## 2019-10-27 MED ORDER — METHYLPREDNISOLONE SODIUM SUCC 40 MG IJ SOLR
40.0000 mg | Freq: Three times a day (TID) | INTRAMUSCULAR | Status: DC
  Administered 2019-10-27 – 2019-10-29 (×7): 40 mg via INTRAVENOUS
  Filled 2019-10-27 (×7): qty 1

## 2019-10-27 MED ORDER — SIMVASTATIN 20 MG PO TABS
40.0000 mg | ORAL_TABLET | Freq: Every day | ORAL | Status: DC
  Administered 2019-10-27 – 2019-10-28 (×2): 40 mg via ORAL
  Filled 2019-10-27 (×2): qty 2

## 2019-10-27 MED ORDER — SODIUM POLYSTYRENE SULFONATE 15 GM/60ML PO SUSP
15.0000 g | Freq: Once | ORAL | Status: AC
  Administered 2019-10-27: 09:00:00 15 g via ORAL
  Filled 2019-10-27: qty 60

## 2019-10-27 MED ORDER — ENOXAPARIN SODIUM 60 MG/0.6ML ~~LOC~~ SOLN
0.5000 mg/kg | SUBCUTANEOUS | Status: DC
  Administered 2019-10-28 – 2019-10-29 (×2): 55 mg via SUBCUTANEOUS
  Filled 2019-10-27 (×2): qty 0.6

## 2019-10-27 NOTE — Progress Notes (Addendum)
PROGRESS NOTE    Christopher Dennis  [id number] DOB: 06/02/1966 DOA: 10/26/2019 PCP: Department, [place]:   Principal Problem:   Pneumonia due to COVID-19 virus Active Problems:   CAD (coronary artery disease)   HTN (hypertension)   Hyperlipidemia   Diabetes mellitus type 2, uncomplicated ([facility])    Christopher Dennis is an 54 y.o. male with PMH significant for HTN, DM 2, HL and CAD status post stenting with no history of MI was diagnosed with Covid 10/20/2019 (can be found under care [facility]).  Patient was seen in the ED earlier this week with reassuring work-up.  He was discharged home on azithromycin.  He states he was doing okay until 2 days ago when he had significant increase in fatigue and shortness of breath.     COVID-19 pneumonia Patient clearly has pneumonia by chest imaging however is not yet hypoxic. He has tachypnea with minimal exertion.  Started on remdesivir and steroids. PLAN: --continue Remdesivir  --increase steroid to IV solu-medrol 40 mg q8h since CRP trending up  --No need for continuous pulse ox  HTN Hypotensive on presentation, so home antihypertensives held.  DM2 Hold home oral agents. SSI TID.  CAD --continue home plavix and statin    DVT prophylaxis: Lovenox SQ Code Status: Full code  Family Communication: wife updated on the phone today Disposition Plan: home tomorrow if stable   Subjective and Interval History:  Reported DOE and some cough, but already off O2.  No fever, chest pain, abdominal pain, N/V/D, dysuria, increased swelling.   Objective: Vitals:   10/26/19 1957 10/27/19 0025 10/27/19 0715 10/27/19 0800  BP: 116/86 119/72 119/83   Pulse: 96 84 78 77  Resp: 20 17 20 15   Temp: 98.4 F (36.9 C) 97.9 F (36.6 C) 98.1 F (36.7 C)   TempSrc: Oral Oral Oral   SpO2: 97% 98% 98% 97%  Weight:      Height:        Intake/Output Summary (Last 24  hours) at 10/27/2019 1441 Last data filed at 10/27/2019 1127 Gross per 24 hour  Intake 340.79 ml  Output 1250 ml  Net -909.21 ml   Filed Weights   10/26/19 1111  Weight: 108.9 kg    Examination:   Constitutional: NAD, AAOx3 HEENT: conjunctivae and lids normal, EOMI CV: RRR no M,R,G. Distal pulses +2.  No cyanosis.   RESP: crackles over RLL, normal respiratory effort, on RA GI: +BS, NTND Extremities: No effusions, edema, or tenderness in BLE SKIN: warm, dry and intact Neuro: II - XII grossly intact.  Sensation intact Psych: Normal mood and affect.     Data Reviewed: I have personally reviewed following labs and imaging studies  CBC: Recent Labs  Lab 10/22/19 1514 10/26/19 1115 10/27/19 0700  WBC 6.3 8.9 4.7  NEUTROABS  --   --  3.6  HGB 14.7 14.9 13.6  HCT 44.2 45.2 42.2  MCV 89.5 89.7 91.3  PLT 162 251 [id number]   Basic Metabolic Panel: Recent Labs  Lab 10/22/19 1626 10/26/19 1115 10/26/19 1246 10/27/19 0700  NA 134* 135 137 137  K 4.5 4.2 4.8 5.2*  CL 101 101 102 101  CO2 23 25 26 25   GLUCOSE 111* 181* 150* 192*  BUN 20 19 19 20   CREATININE 0.98 0.96 0.96 0.83  CALCIUM 8.5* 9.1 9.3 9.2   GFR: Estimated Creatinine Clearance: 126.6 mL/min (by C-G formula based on SCr of 0.83  mg/dL). Liver Function Tests: Recent Labs  Lab 10/26/19 1246 10/27/19 0700  AST 47* 33  ALT 34 28  ALKPHOS 58 54  BILITOT 0.8 0.6  PROT 7.5 7.1  ALBUMIN 3.8 3.4*   No results for input(s): LIPASE, AMYLASE in the last 168 hours. No results for input(s): AMMONIA in the last 168 hours. Coagulation Profile: No results for input(s): INR, PROTIME in the last 168 hours. Cardiac Enzymes: No results for input(s): CKTOTAL, CKMB, CKMBINDEX, TROPONINI in the last 168 hours. BNP (last 3 results) No results for input(s): PROBNP in the last 8760 hours. HbA1C: Recent Labs    10/26/19 1841  HGBA1C 7.8*   CBG: Recent Labs  Lab 10/26/19 2032 10/27/19 0720 10/27/19 1118  GLUCAP 148* 181*  286*   Lipid Profile: Recent Labs    10/26/19 1238  TRIG 159*   Thyroid Function Tests: No results for input(s): TSH, T4TOTAL, FREET4, T3FREE, THYROIDAB in the last 72 hours. Anemia Panel: Recent Labs    10/26/19 1246  FERRITIN 323   Sepsis Labs: Recent Labs  Lab 10/22/19 1626 10/26/19 1246 10/26/19 1639  PROCALCITON  --  <0.10  --   LATICACIDVEN 1.3 2.0* 2.0*    Recent Results (from the past 240 hour(s))  Blood Culture (routine x 2)     Status: None (Preliminary result)   Collection Time: 10/26/19 12:46 PM   Specimen: BLOOD  Result Value Ref Range Status   Specimen Description BLOOD RIGHT ANTECUBITAL  Final   Special Requests   Final    BOTTLES DRAWN AEROBIC AND ANAEROBIC Blood Culture adequate volume   Culture   Final    NO GROWTH < 24 HOURS Performed at [facility], [address] [place], [place] [address]    Report Status PENDING  Incomplete  Blood Culture (routine x 2)     Status: None (Preliminary result)   Collection Time: 10/26/19 12:46 PM   Specimen: BLOOD  Result Value Ref Range Status   Specimen Description BLOOD BLOOD RIGHT FOREARM  Final   Special Requests   Final    BOTTLES DRAWN AEROBIC AND ANAEROBIC Blood Culture adequate volume   Culture   Final    NO GROWTH < 24 HOURS Performed at [facility], [address] [place], [place] [address]    Report Status PENDING  Incomplete      Radiology Studies: DG Chest 2 View  Result Date: 10/26/2019 CLINICAL DATA:  Chest pain and shortness of breath in a patient who is COVID-19 positive. EXAM: CHEST - 2 VIEW COMPARISON:  PA and lateral chest 10/22/2019. FINDINGS: Patchy bilateral airspace disease is unchanged. Heart size is normal. No pneumothorax or pleural effusion. No acute or focal bony abnormality. IMPRESSION: No change in patchy bilateral airspace disease consistent with pneumonia. Electronically Signed   By: Inge Rise M.D.   On: 10/26/2019 11:44   CT Angio Chest PE W  and/or Wo Contrast  Result Date: 10/26/2019 CLINICAL DATA:  Worsening shortness of breath, weakness, dyspnea, recent COVID-19 diagnosis EXAM: CT ANGIOGRAPHY CHEST WITH CONTRAST TECHNIQUE: Multidetector CT imaging of the chest was performed using the standard protocol during bolus administration of intravenous contrast. Multiplanar CT image reconstructions and MIPs were obtained to evaluate the vascular anatomy. CONTRAST:  34mL OMNIPAQUE IOHEXOL 350 MG/ML SOLN COMPARISON:  Chest radiographs, 10/26/2019, 10/22/2019, CT chest, 06/08/2011 FINDINGS: Cardiovascular: Satisfactory opacification of the pulmonary arteries to the segmental level. No evidence of pulmonary embolism. Normal heart size. Three-vessel coronary artery calcifications and/or stents. No  pericardial effusion. Aortic atherosclerosis. Mediastinum/Nodes: No enlarged mediastinal, hilar, or axillary lymph nodes. Thyroid gland, trachea, and esophagus demonstrate no significant findings. Lungs/Pleura: There is extensive bilateral peripheral heterogeneous and ground-glass airspace opacity. No pleural effusion or pneumothorax. Upper Abdomen: No acute abnormality. Musculoskeletal: No chest wall abnormality. No acute or significant osseous findings. Review of the MIP images confirms the above findings. IMPRESSION: 1.  Negative examination for pulmonary embolism. 2. Extensive bilateral peripheral heterogeneous and ground-glass airspace opacity, in keeping with COVID airspace disease. 3.  Coronary artery disease.  Aortic Atherosclerosis (ICD10-I70.0). Electronically Signed   By: Eddie Candle M.D.   On: 10/26/2019 16:11     Scheduled Meds: . dexamethasone  6 mg Oral Q24H  . [START ON 10/28/2019] enoxaparin (LOVENOX) injection  0.5 mg/kg Subcutaneous Q24H  . influenza vac split quadrivalent PF  0.5 mL Intramuscular Tomorrow-1000  . insulin aspart  0-20 Units Subcutaneous TID WC  . pneumococcal 23 valent vaccine  0.5 mL Intramuscular Tomorrow-1000  . sodium  chloride flush  3 mL Intravenous Q12H   Continuous Infusions: . sodium chloride Stopped (10/27/19 0911)  . remdesivir 100 mg in NS 100 mL Stopped (10/27/19 0906)     LOS: 1 day     Enzo Bi, MD [facility] If 7PM-7AM, please contact night-coverage 10/27/2019, 2:41 PM

## 2019-10-27 NOTE — Progress Notes (Signed)
Dr Billie Ruddy aware that pts k+ is 5.2

## 2019-10-27 NOTE — Progress Notes (Signed)
Daily update given to wife 

## 2019-10-28 LAB — MAGNESIUM: Magnesium: 2.3 mg/dL (ref 1.7–2.4)

## 2019-10-28 LAB — BASIC METABOLIC PANEL
Anion gap: 12 (ref 5–15)
BUN: 28 mg/dL — ABNORMAL HIGH (ref 6–20)
CO2: 22 mmol/L (ref 22–32)
Calcium: 9 mg/dL (ref 8.9–10.3)
Chloride: 102 mmol/L (ref 98–111)
Creatinine, Ser: 0.84 mg/dL (ref 0.61–1.24)
GFR calc Af Amer: >60 mL/min (ref >60)
GFR calc non Af Amer: >60 mL/min (ref >60)
Glucose, Bld: 319 mg/dL — ABNORMAL HIGH (ref 70–99)
Potassium: 4.6 mmol/L (ref 3.5–5.1)
Sodium: 136 mmol/L (ref 135–145)

## 2019-10-28 LAB — CBC
HCT: 38.6 % — ABNORMAL LOW (ref 39.0–52.0)
Hemoglobin: 12.9 g/dL — ABNORMAL LOW (ref 13.0–17.0)
MCH: 29.9 pg (ref 26.0–34.0)
MCHC: 33.4 g/dL (ref 30.0–36.0)
MCV: 89.4 fL (ref 80.0–100.0)
Platelets: 264 10*3/uL (ref 150–400)
RBC: 4.32 MIL/uL (ref 4.22–5.81)
RDW: 12.6 % (ref 11.5–15.5)
WBC: 7.7 10*3/uL (ref 4.0–10.5)
nRBC: 0 % (ref 0.0–0.2)

## 2019-10-28 LAB — GLUCOSE, CAPILLARY
Glucose-Capillary: 283 mg/dL — ABNORMAL HIGH (ref 70–99)
Glucose-Capillary: 458 mg/dL — ABNORMAL HIGH (ref 70–99)
Glucose-Capillary: 404 mg/dL — ABNORMAL HIGH (ref 70–99)
Glucose-Capillary: 314 mg/dL — ABNORMAL HIGH (ref 70–99)

## 2019-10-28 LAB — C-REACTIVE PROTEIN: CRP: 5.8 mg/dL — ABNORMAL HIGH (ref <1.0)

## 2019-10-28 MED ORDER — INSULIN DETEMIR 100 UNIT/ML ~~LOC~~ SOLN
25.0000 [IU] | Freq: Two times a day (BID) | SUBCUTANEOUS | Status: DC
  Administered 2019-10-28 – 2019-10-29 (×2): 25 [IU] via SUBCUTANEOUS
  Filled 2019-10-28 (×2): qty 1

## 2019-10-28 MED ORDER — SODIUM CHLORIDE 0.9 % IV SOLN
INTRAVENOUS | Status: DC | PRN
  Administered 2019-10-28: 08:00:00 25 mL via INTRAVENOUS

## 2019-10-28 MED ORDER — INSULIN DETEMIR 100 UNIT/ML ~~LOC~~ SOLN
15.0000 [IU] | Freq: Two times a day (BID) | SUBCUTANEOUS | Status: DC
  Administered 2019-10-28: 15 [IU] via SUBCUTANEOUS
  Filled 2019-10-28: qty 1

## 2019-10-28 MED ORDER — INSULIN GLARGINE 100 UNIT/ML SOLOSTAR PEN: 10.0000 [IU] | PEN_INJECTOR | Freq: Every day | SUBCUTANEOUS | Status: DC

## 2019-10-28 NOTE — Progress Notes (Signed)
PROGRESS NOTE    Christopher Dennis  [id number] DOB: 1965/12/29 DOA: 10/26/2019 PCP: Department, [place]:   Principal Problem:   Pneumonia due to COVID-19 virus Active Problems:   CAD (coronary artery disease)   HTN (hypertension)   Hyperlipidemia   Diabetes mellitus type 2, uncomplicated (HCC)    Christopher Dennis is an 54 y.o. male with PMH significant for HTN, DM 2, HL and CAD status post stenting with no history of MI was diagnosed with Covid 10/20/2019 (can be found under care [facility]).  Patient was seen in the ED earlier this week with reassuring work-up.  He was discharged home on azithromycin.  He states he was doing okay until 2 days ago when he had significant increase in fatigue and shortness of breath.     COVID-19 pneumonia Patient clearly has pneumonia by chest imaging however is not yet hypoxic. He has tachypnea with minimal exertion.  Started on remdesivir and steroids. PLAN: --continue Remdesivir  --continue IV solu-medrol 40 mg q8h due to elevated CRP  --No need for continuous pulse ox  HTN Hypotensive on presentation, so home antihypertensives held.  DM2 Hold home oral agents. --Levemir 25 BID --SSI TID  CAD --continue home statin --d/c plavix as pt no longer takes   DVT prophylaxis: Lovenox SQ Code Status: Full code  Family Communication:  Disposition Plan: home tomorrow if CRP trending down and respiratory status stable   Subjective and Interval History:  Pt reported feeling better, had dyspnea mostly on exertion, but able to ambulate to bathroom.  No fever, chest pain, abdominal pain, N/V/D, dysuria, increased swelling.  Good PO intake.   Objective: Vitals:   10/28/19 0019 10/28/19 0726 10/28/19 1535 10/28/19 1556  BP: (!) 144/71 134/85 (!) 142/109 (!) 143/83  Pulse: 95 71 99 91  Resp: 16 16 18    Temp: 97.6 F (36.4 C) 98.2 F (36.8 C) 97.6 F (36.4 C)     TempSrc: Oral  Oral   SpO2: 95% 94% 97%   Weight:      Height:        Intake/Output Summary (Last 24 hours) at 10/28/2019 1810 Last data filed at 10/28/2019 1030 Gross per 24 hour  Intake 115.28 ml  Output --  Net 115.28 ml   Filed Weights   10/26/19 1111  Weight: 108.9 kg    Examination:   Constitutional: NAD, AAOx3 HEENT: conjunctivae and lids normal, EOMI CV: RRR no M,R,G. Distal pulses +2.  No cyanosis.   RESP: clear, normal respiratory effort, on RA GI: +BS, NTND Extremities: No effusions, edema, or tenderness in BLE SKIN: warm, dry and intact Neuro: II - XII grossly intact.  Sensation intact Psych: Normal mood and affect.     Data Reviewed: I have personally reviewed following labs and imaging studies  CBC: Recent Labs  Lab 10/22/19 1514 10/26/19 1115 10/27/19 0700 10/28/19 0644  WBC 6.3 8.9 4.7 7.7  NEUTROABS  --   --  3.6  --   HGB 14.7 14.9 13.6 12.9*  HCT 44.2 45.2 42.2 38.6*  MCV 89.5 89.7 91.3 89.4  PLT 162 251 256 [id number]   Basic Metabolic Panel: Recent Labs  Lab 10/22/19 1626 10/26/19 1115 10/26/19 1246 10/27/19 0700 10/28/19 0644  NA 134* 135 137 137 136  K 4.5 4.2 4.8 5.2* 4.6  CL 101 101 102 101 102  CO2 23 25 26 25 22   GLUCOSE 111* 181* 150* 192* 319*  BUN 20 19 19 20  28*  CREATININE 0.98 0.96 0.96 0.83 0.84  CALCIUM 8.5* 9.1 9.3 9.2 9.0  MG  --   --   --   --  2.3   GFR: Estimated Creatinine Clearance: 125.1 mL/min (by C-G formula based on SCr of 0.84 mg/dL). Liver Function Tests: Recent Labs  Lab 10/26/19 1246 10/27/19 0700  AST 47* 33  ALT 34 28  ALKPHOS 58 54  BILITOT 0.8 0.6  PROT 7.5 7.1  ALBUMIN 3.8 3.4*   No results for input(s): LIPASE, AMYLASE in the last 168 hours. No results for input(s): AMMONIA in the last 168 hours. Coagulation Profile: No results for input(s): INR, PROTIME in the last 168 hours. Cardiac Enzymes: No results for input(s): CKTOTAL, CKMB, CKMBINDEX, TROPONINI in the last 168 hours. BNP (last  3 results) No results for input(s): PROBNP in the last 8760 hours. HbA1C: Recent Labs    10/26/19 1841  HGBA1C 7.8*   CBG: Recent Labs  Lab 10/27/19 1615 10/27/19 2135 10/28/19 0724 10/28/19 1133 10/28/19 1627  GLUCAP 224* 318* 283* 458* 404*   Lipid Profile: Recent Labs    10/26/19 1238  TRIG 159*   Thyroid Function Tests: No results for input(s): TSH, T4TOTAL, FREET4, T3FREE, THYROIDAB in the last 72 hours. Anemia Panel: Recent Labs    10/26/19 1246  FERRITIN 323   Sepsis Labs: Recent Labs  Lab 10/22/19 1626 10/26/19 1246 10/26/19 1639  PROCALCITON  --  <0.10  --   LATICACIDVEN 1.3 2.0* 2.0*    Recent Results (from the past 240 hour(s))  Blood Culture (routine x 2)     Status: None (Preliminary result)   Collection Time: 10/26/19 12:46 PM   Specimen: BLOOD  Result Value Ref Range Status   Specimen Description BLOOD RIGHT ANTECUBITAL  Final   Special Requests   Final    BOTTLES DRAWN AEROBIC AND ANAEROBIC Blood Culture adequate volume   Culture   Final    NO GROWTH 2 DAYS Performed at [facility], [address] [place], [place] [address]    Report Status PENDING  Incomplete  Blood Culture (routine x 2)     Status: None (Preliminary result)   Collection Time: 10/26/19 12:46 PM   Specimen: BLOOD  Result Value Ref Range Status   Specimen Description BLOOD BLOOD RIGHT FOREARM  Final   Special Requests   Final    BOTTLES DRAWN AEROBIC AND ANAEROBIC Blood Culture adequate volume   Culture   Final    NO GROWTH 2 DAYS Performed at [facility], [address], [place], [place] [address]    Report Status PENDING  Incomplete      Radiology Studies: No results found.   Scheduled Meds: . enoxaparin (LOVENOX) injection  0.5 mg/kg Subcutaneous Q24H  . influenza vac split quadrivalent PF  0.5 mL Intramuscular Tomorrow-1000  . insulin aspart  0-20 Units Subcutaneous TID WC  . insulin detemir  25 Units Subcutaneous BID  .  methylPREDNISolone (SOLU-MEDROL) injection  40 mg Intravenous Q8H  . pneumococcal 23 valent vaccine  0.5 mL Intramuscular Tomorrow-1000  . simvastatin  40 mg Oral q1800  . sodium chloride flush  3 mL Intravenous Q12H   Continuous Infusions: . sodium chloride Stopped (10/27/19 0911)  . sodium chloride Stopped (10/28/19 1017)  . remdesivir 100 mg in NS 100 mL Stopped (10/28/19 0846)     LOS: 2 days     Enzo Bi, MD [facility] If 7PM-7AM, please contact night-coverage  10/28/2019, 6:10 PM

## 2019-10-28 NOTE — Progress Notes (Signed)
Patient's CBG is 458, SSI dose is 20 units.  Dr. Billie Ruddy made aware and doesn't want an additional SSI given.  Patient received scheduled 20 unit SSI.  Clarise Cruz, BSN

## 2019-10-28 NOTE — Progress Notes (Signed)
Patient ambulated around nurse's station and O2 sat stayed above 92% the entire time. Patient tolerated well.  Patient sitting in chair for all meals. Christopher Dennis, BSN

## 2019-10-29 LAB — BASIC METABOLIC PANEL
Anion gap: 7 (ref 5–15)
BUN: 32 mg/dL — ABNORMAL HIGH (ref 6–20)
CO2: 22 mmol/L (ref 22–32)
Calcium: 8.8 mg/dL — ABNORMAL LOW (ref 8.9–10.3)
Chloride: 105 mmol/L (ref 98–111)
Creatinine, Ser: 0.86 mg/dL (ref 0.61–1.24)
GFR calc Af Amer: >60 mL/min (ref >60)
GFR calc non Af Amer: >60 mL/min (ref >60)
Glucose, Bld: 363 mg/dL — ABNORMAL HIGH (ref 70–99)
Potassium: 4.8 mmol/L (ref 3.5–5.1)
Sodium: 134 mmol/L — ABNORMAL LOW (ref 135–145)

## 2019-10-29 LAB — CBC
HCT: 38 % — ABNORMAL LOW (ref 39.0–52.0)
Hemoglobin: 12.8 g/dL — ABNORMAL LOW (ref 13.0–17.0)
MCH: 29.9 pg (ref 26.0–34.0)
MCHC: 33.7 g/dL (ref 30.0–36.0)
MCV: 88.8 fL (ref 80.0–100.0)
Platelets: 271 10*3/uL (ref 150–400)
RBC: 4.28 MIL/uL (ref 4.22–5.81)
RDW: 12.5 % (ref 11.5–15.5)
WBC: 10.6 10*3/uL — ABNORMAL HIGH (ref 4.0–10.5)
nRBC: 0 % (ref 0.0–0.2)

## 2019-10-29 LAB — GLUCOSE, CAPILLARY
Glucose-Capillary: 313 mg/dL — ABNORMAL HIGH (ref 70–99)
Glucose-Capillary: 445 mg/dL — ABNORMAL HIGH (ref 70–99)

## 2019-10-29 LAB — MAGNESIUM: Magnesium: 2 mg/dL (ref 1.7–2.4)

## 2019-10-29 LAB — C-REACTIVE PROTEIN: CRP: 2.6 mg/dL — ABNORMAL HIGH (ref <1.0)

## 2019-10-29 MED ORDER — REMDESIVIR 100 MG IV SOLR: 100.0000 mg | INTRAVENOUS | 0 refills | Status: AC

## 2019-10-29 MED ORDER — PNEUMOCOCCAL VAC POLYVALENT 25 MCG/0.5ML IJ INJ: 0.5000 mL | INJECTION | INTRAMUSCULAR | 0 refills | Status: DC

## 2019-10-29 MED ORDER — INFLUENZA VAC SPLIT QUAD 0.5 ML IM SUSY: 0.5000 mL | PREFILLED_SYRINGE | INTRAMUSCULAR | 0 refills | Status: DC

## 2019-10-29 MED ORDER — DEXAMETHASONE 6 MG PO TABS: 6.0000 mg | ORAL_TABLET | Freq: Every day | ORAL | 0 refills | Status: AC

## 2019-10-29 MED ORDER — GUAIFENESIN-DM 100-10 MG/5ML PO SYRP: 5.0000 mL | ORAL_SOLUTION | ORAL | 0 refills | Status: DC | PRN

## 2019-10-29 NOTE — Progress Notes (Addendum)
Inpatient Diabetes Program Recommendations  AACE/ADA: New Consensus Statement on Inpatient Glycemic Control (2015)  Target Ranges:  Prepandial:   less than 140 mg/dL      Peak postprandial:   less than 180 mg/dL (1-2 hours)      Critically ill patients:  140 - 180 mg/dL   Results for RONDEL, KEATING (MRN [id number]) as of 10/29/2019 07:59  Ref. Range 10/27/2019 07:20 10/27/2019 11:18 10/27/2019 16:15 10/27/2019 21:35  Glucose-Capillary Latest Ref Range: 70 - 99 mg/dL 181 (H)  4 units NOVOLOG  286 (H)  11 units NOVOLOG  224 (H)  7 units NOVOLOG  318 (H)   Results for KOLSTON, HUSO (MRN [id number]) as of 10/29/2019 07:59  Ref. Range 10/28/2019 07:24 10/28/2019 11:33 10/28/2019 16:27 10/28/2019 21:07  Glucose-Capillary Latest Ref Range: 70 - 99 mg/dL 283 (H)  11 units NOVOLOG +  15 units LEVEMIR  458 (H)  20 units NOVOLOG  404 (H)  20 units NOVOLOG  314 (H)     25 units LEVEMIR   Results for DAXSON, YOKEL (MRN [id number]) as of 10/29/2019 07:59  Ref. Range 10/29/2019 07:36  Glucose-Capillary Latest Ref Range: 70 - 99 mg/dL 313 (H)    Results for HERACLIO, FICKLE (MRN [id number]) as of 10/29/2019 07:59  Ref. Range 10/26/2019 18:41  Hemoglobin A1C Latest Ref Range: 4.8 - 5.6 % 7.8 (H)    Admit with: Pneumonia due to COVID-19 virus (COVID diagnosed 10/20/2019)  History: DM  Home DM Meds: Jardiance 25 mg Daily       Xultophy (insulin degludec + liraglutide) 50 units QHS       Metformin 1000 mg BID       Actos 30 mg Daily  Current Orders: Levemir 25 units BID      Novolog Resistant Correction Scale/ SSI (0-20 units) TID AC     MD- Note patient getting Solumedrol 40 mg Q8 hours.  CBGs elevated likely due to the effects of the steroids.  Note Levemir increased to 25 units BID last PM (pt gets 50 units basal insulin (insulin degludec) at home in his dose of Xultophy daily)--Pt got total of 40 units Levemir yesterday--will get total of 50 units Levemir  today.  May consider the following if pt to remain on IV Steroids today:  Add Novolog Meal Coverage: Novolog 10 units TID with meals    --Will follow patient during hospitalization--  Wyn Quaker RN, MSN, CDE Diabetes Coordinator Inpatient Glycemic Control Team Team Pager: [phone] (8a-5p)

## 2019-10-29 NOTE — TOC Initial Note (Signed)
Transition of Care [facility]) - Initial/Assessment Note    Patient Details  Name: Christopher Dennis MRN: [id number] Date of Birth: Jul 29, 1966  [facility]) CM/SW Contact:    Shelbie Ammons, RN Phone Number: 10/29/2019, 9:41 AM  Clinical Narrative:    RNCM contacted patient via telephone due to + Covid diagnosis. Patient reports that he is beginning to feel better but is hopeful that he can stay and get his last dose of Remdisivir prior to discharge. He reports that he normally works for the [employer] and is independent however hasn't worked since his Covid diagnosis. Patient reports that he normally sees the county physician for any needs and uses [facility] in [place] for his prescriptions. RNCM will remain available for any further needs.                   Patient Goals and CMS Choice        Expected Discharge Plan and Services                                                Prior Living Arrangements/Services                       Activities of Daily Living Home Assistive Devices/Equipment: None ADL Screening (condition at time of admission) Patient's cognitive ability adequate to safely complete daily activities?: Yes Is the patient deaf or have difficulty hearing?: No Does the patient have difficulty seeing, even when wearing glasses/contacts?: No Does the patient have difficulty concentrating, remembering, or making decisions?: No Patient able to express need for assistance with ADLs?: Yes Does the patient have difficulty dressing or bathing?: No Independently performs ADLs?: Yes (appropriate for developmental age) Does the patient have difficulty walking or climbing stairs?: No Weakness of Legs: None Weakness of Arms/Hands: None  Permission Sought/Granted                  Emotional Assessment              Admission diagnosis:  Shortness of breath [R06.02] COVID-19 virus infection [U07.1] Pneumonia due to COVID-19  virus [U07.1, J12.82] Patient Active Problem List   Diagnosis Date Noted  . Pneumonia due to COVID-19 virus 10/26/2019  . Uncontrolled type 2 diabetes mellitus with hyperglycemia, without long-term current use of insulin ([place]) 03/02/2018  . Type 2 diabetes mellitus ([place]) 03/02/2018  . Trouble swallowing   . Gastritis   . Stricture and stenosis of esophagus   . Special screening for malignant neoplasms, colon   . Benign neoplasm of sigmoid colon   . Fourth degree hemorrhoids   . DDD (degenerative disc disease), lumbosacral 12/11/2012  . Displacement of lumbar intervertebral disc without myelopathy 12/11/2012  . Thoracic or lumbosacral neuritis or radiculitis, unspecified 12/11/2012  . Spinal stenosis of lumbar region without neurogenic claudication 12/11/2012  . Back pain 12/04/2012  . CAD (coronary artery disease) 12/04/2012  . Clinical depression 12/04/2012  . Diabetes ([place]) 12/04/2012  . Elevated liver function tests 12/04/2012  . HTN (hypertension) 12/04/2012  . Hyperlipidemia 12/04/2012  . Hyponatremia 12/04/2012  . Seizure-like activity ([place]) 12/04/2012  . Neuropathy 12/04/2012  . Diabetes mellitus type 2, uncomplicated ([place]) [id number]   PCP:  Department, [place]:   [place] [place]  [address] [place] [address] Phone: [phone] Fax: [phone]     Social Determinants of Health (SDOH) Interventions    Readmission Risk Interventions No flowsheet data found.

## 2019-10-29 NOTE — Discharge Summary (Addendum)
Physician Discharge Summary   Christopher Dennis  male DOB: [birth year]  [id number]  PCP: Albina Billet, MD  Admit date: 10/26/2019 Discharge date: 10/29/2019  Admitted From: home Disposition:  home CODE STATUS: Full code  Discharge Instructions    Diet - low sodium heart healthy   Complete by: As directed    Discharge instructions   Complete by: As directed    You have COVID infection, but you have done well without needing supplemental oxygen, so you can go home to recover.  You are scheduled for your last dose of Remdisivir (COVID TX) tomorrow, at 10:00 AM on Tuesday 3/9, at [facility] at [address].  We have scheduled transportation to bring you and from the clinic.    Please continue to take steroid (Decadron) for 6 more days after discharge to reduce the inflammation COVID infection had caused in your body.     Dr. Enzo Bi - -   Increase activity slowly   Complete by: As directed        Hospital Course:  For full details, please see H&P, progress notes, consult notes and ancillary notes.  Briefly,  Christopher Armacost Stricklandis an 54 y.o.Caucasian malewith PMH significant for HTN, DM 2, HL and CAD status post stenting with no history of MI was diagnosed with Covid 10/20/2019 (can be found under care [facility]).Patient was seen in the ED earlier this week with reassuring work-up. He was discharged home on azithromycin. He stated he was doing okay until 2 days prior to presentation when he had significant increase in fatigue and shortness of breath.    COVID-19pneumonia CT chest showed "Extensive bilateral peripheral heterogeneous and ground-glass airspace opacity." He has tachypnea with minimal exertion, however was not hypoxic.  Pt received 4 doses of Remdesivir and discharged with arrangements made for pt to receive his 5th dose in infusion clinic.  Abx d/c'ed since procal neg.  Pt received Decadron on admission,  however, due to his CRP trending up from 6.1 to 9.8, steroid was increased to IV solu-medrol 40 mg q8h, which lowered the CRP to 2.6 prior to discharge.  Pt was discharged on 6 more days of PO decadron.    HTN Hypotensive on presentation, so home Lisinopril held while inpatient, and resumed after discharge.  DM2 Pt received Levemir 25 BID and SSI TID while inpatient.  Pt was discharged back to his home diabetic regimen.  CAD Continued home statin   Discharge Diagnoses:  Principal Problem:   Pneumonia due to COVID-19 virus Active Problems:   CAD (coronary artery disease)   HTN (hypertension)   Hyperlipidemia   Diabetes mellitus type 2, uncomplicated [facility])    Discharge Instructions:  Allergies as of 10/29/2019   No Known Allergies     Medication List    STOP taking these medications   azithromycin 250 MG tablet Commonly known as: Zithromax Z-Pak     TAKE these medications   albuterol 108 (90 Base) MCG/ACT inhaler Commonly known as: VENTOLIN HFA Inhale 2 puffs into the lungs every 6 (six) hours as needed for wheezing or shortness of breath.   dexamethasone 6 MG tablet Commonly known as: DECADRON Take 1 tablet (6 mg total) by mouth daily for 6 days. To reduce inflammation caused by COVID infection. Start taking on: October 30, 2019   guaiFENesin-dextromethorphan 100-10 MG/5ML syrup Commonly known as: ROBITUSSIN DM Take 5 mLs by mouth every 4 (four) hours as needed for cough.  Jardiance 25 MG Tabs tablet Generic drug: empagliflozin Take 25 mg by mouth daily.   lisinopril 20 MG tablet Commonly known as: ZESTRIL Take 20 mg by mouth daily.   metFORMIN 1000 MG tablet Commonly known as: GLUCOPHAGE Take 1,000 mg by mouth 2 (two) times daily.   ONE TOUCH ULTRA TEST test strip Generic drug: glucose blood TEST AT LEAST TID   pioglitazone 30 MG tablet Commonly known as: ACTOS Take 30 mg by mouth daily.   remdesivir 100 MG Solr Inject 100 mg into the vein daily  for 1 day. Start taking on: October 30, 2019   simvastatin 40 MG tablet Commonly known as: ZOCOR Take 40 mg by mouth at bedtime.   Vitamin D (Ergocalciferol) 1.25 MG (50000 UNIT) Caps capsule Commonly known as: DRISDOL Take 50,000 Units by mouth once a week.   Xultophy 100-3.6 UNIT-MG/ML Sopn Generic drug: Insulin Degludec-Liraglutide Inject 50 Units into the skin at bedtime.       Follow-up Information    Albina Billet, MD. Go on 11/20/2019.   Specialty: Internal Medicine Why: @ 2:00 PM  Contact information: [address] [place]   [place] [address] [phone]           No Known Allergies   The results of significant diagnostics from this hospitalization (including imaging, microbiology, ancillary and laboratory) are listed below for reference.   Consultations:   Procedures/Studies: DG Chest 2 View  Result Date: 10/26/2019 CLINICAL DATA:  Chest pain and shortness of breath in a patient who is COVID-19 positive. EXAM: CHEST - 2 VIEW COMPARISON:  PA and lateral chest 10/22/2019. FINDINGS: Patchy bilateral airspace disease is unchanged. Heart size is normal. No pneumothorax or pleural effusion. No acute or focal bony abnormality. IMPRESSION: No change in patchy bilateral airspace disease consistent with pneumonia. Electronically Signed   By: Inge Rise M.D.   On: 10/26/2019 11:44   DG Chest 2 View  Result Date: 10/22/2019 CLINICAL DATA:  54 year old male with shortness of breath. EXAM: CHEST - 2 VIEW COMPARISON:  Chest radiograph dated 06/08/2011. FINDINGS: Shallow inspiration. Faint bilateral interstitial and peribronchial densities may represent atelectasis or developing infiltrate. Clinical correlation is recommended. No focal consolidation, pleural effusion, or pneumothorax. The cardiac silhouette is within normal limits. No acute osseous pathology. Lower cervical ACDF. IMPRESSION: Faint bilateral interstitial and peribronchial densities may represent atelectasis  or developing infiltrate. Electronically Signed   By: Anner Crete M.D.   On: 10/22/2019 15:47   CT Angio Chest PE W and/or Wo Contrast  Result Date: 10/26/2019 CLINICAL DATA:  Worsening shortness of breath, weakness, dyspnea, recent COVID-19 diagnosis EXAM: CT ANGIOGRAPHY CHEST WITH CONTRAST TECHNIQUE: Multidetector CT imaging of the chest was performed using the standard protocol during bolus administration of intravenous contrast. Multiplanar CT image reconstructions and MIPs were obtained to evaluate the vascular anatomy. CONTRAST:  16mL OMNIPAQUE IOHEXOL 350 MG/ML SOLN COMPARISON:  Chest radiographs, 10/26/2019, 10/22/2019, CT chest, 06/08/2011 FINDINGS: Cardiovascular: Satisfactory opacification of the pulmonary arteries to the segmental level. No evidence of pulmonary embolism. Normal heart size. Three-vessel coronary artery calcifications and/or stents. No pericardial effusion. Aortic atherosclerosis. Mediastinum/Nodes: No enlarged mediastinal, hilar, or axillary lymph nodes. Thyroid gland, trachea, and esophagus demonstrate no significant findings. Lungs/Pleura: There is extensive bilateral peripheral heterogeneous and ground-glass airspace opacity. No pleural effusion or pneumothorax. Upper Abdomen: No acute abnormality. Musculoskeletal: No chest wall abnormality. No acute or significant osseous findings. Review of the MIP images confirms the above findings. IMPRESSION: 1.  Negative examination  for pulmonary embolism. 2. Extensive bilateral peripheral heterogeneous and ground-glass airspace opacity, in keeping with COVID airspace disease. 3.  Coronary artery disease.  Aortic Atherosclerosis (ICD10-I70.0). Electronically Signed   By: Eddie Candle M.D.   On: 10/26/2019 16:11      Labs: BNP (last 3 results) No results for input(s): BNP in the last 8760 hours. Basic Metabolic Panel: Recent Labs  Lab 10/26/19 1115 10/26/19 1246 10/27/19 0700 10/28/19 0644 10/29/19 0536  NA 135 137 137 136  134*  K 4.2 4.8 5.2* 4.6 4.8  CL 101 102 101 102 105  CO2 25 26 25 22 22   GLUCOSE 181* 150* 192* 319* 363*  BUN 19 19 20  28* 32*  CREATININE 0.96 0.96 0.83 0.84 0.86  CALCIUM 9.1 9.3 9.2 9.0 8.8*  MG  --   --   --  2.3 2.0   Liver Function Tests: Recent Labs  Lab 10/26/19 1246 10/27/19 0700  AST 47* 33  ALT 34 28  ALKPHOS 58 54  BILITOT 0.8 0.6  PROT 7.5 7.1  ALBUMIN 3.8 3.4*   No results for input(s): LIPASE, AMYLASE in the last 168 hours. No results for input(s): AMMONIA in the last 168 hours. CBC: Recent Labs  Lab 10/22/19 1514 10/26/19 1115 10/27/19 0700 10/28/19 0644 10/29/19 0536  WBC 6.3 8.9 4.7 7.7 10.6*  NEUTROABS  --   --  3.6  --   --   HGB 14.7 14.9 13.6 12.9* 12.8*  HCT 44.2 45.2 42.2 38.6* 38.0*  MCV 89.5 89.7 91.3 89.4 88.8  PLT 162 251 256 264 271   Cardiac Enzymes: No results for input(s): CKTOTAL, CKMB, CKMBINDEX, TROPONINI in the last 168 hours. BNP: Invalid input(s): POCBNP CBG: Recent Labs  Lab 10/28/19 1133 10/28/19 1627 10/28/19 2107 10/29/19 0736 10/29/19 1048  GLUCAP 458* 404* 314* 313* 445*   D-Dimer No results for input(s): DDIMER in the last 72 hours. Hgb A1c Recent Labs    10/26/19 1841  HGBA1C 7.8*   Lipid Profile No results for input(s): CHOL, HDL, LDLCALC, TRIG, CHOLHDL, LDLDIRECT in the last 72 hours. Thyroid function studies No results for input(s): TSH, T4TOTAL, T3FREE, THYROIDAB in the last 72 hours.  Invalid input(s): FREET3 Anemia work up No results for input(s): VITAMINB12, FOLATE, FERRITIN, TIBC, IRON, RETICCTPCT in the last 72 hours. Urinalysis No results found for: COLORURINE, APPEARANCEUR, [place], [place], [place], [place], [place], [place], [place], UROBILINOGEN, NITRITE, LEUKOCYTESUR Sepsis Labs Invalid input(s): PROCALCITONIN,  WBC,  LACTICIDVEN Microbiology Recent Results (from the past 240 hour(s))  Blood Culture (routine x 2)     Status: None (Preliminary result)   Collection Time:  10/26/19 12:46 PM   Specimen: BLOOD  Result Value Ref Range Status   Specimen Description BLOOD RIGHT ANTECUBITAL  Final   Special Requests   Final    BOTTLES DRAWN AEROBIC AND ANAEROBIC Blood Culture adequate volume   Culture   Final    NO GROWTH 3 DAYS Performed at [facility], [address] [place], [place] [address]    Report Status PENDING  Incomplete  Blood Culture (routine x 2)     Status: None (Preliminary result)   Collection Time: 10/26/19 12:46 PM   Specimen: BLOOD  Result Value Ref Range Status   Specimen Description BLOOD BLOOD RIGHT FOREARM  Final   Special Requests   Final    BOTTLES DRAWN AEROBIC AND ANAEROBIC Blood Culture adequate volume   Culture   Final    NO GROWTH 3 DAYS Performed at  [address] [place], [place] [address]    Report Status PENDING  Incomplete     Total time spend on discharging this patient, including the last patient exam, discussing the hospital stay, instructions for ongoing care as it relates to all pertinent caregivers, as well as preparing the medical discharge records, prescriptions, and/or referrals as applicable, is 30 minutes.    Enzo Bi, MD  [facility] 10/29/2019, 1:35 PM  If 7PM-7AM, please contact night-coverage

## 2019-10-29 NOTE — Progress Notes (Signed)
Patient's CBG is 445, SSI dose is 20 units.  Dr. Billie Ruddy made aware and doesn't want an additional SSI given.  Patient received scheduled 20 unit SSI.  Clarise Cruz, BSN

## 2019-10-29 NOTE — Progress Notes (Signed)
Patient scheduled for outpatient Remdesivir infusion at 10:00 AM on Tuesday 3/9. Please advise them to report to [facility] at [address].  Drive to the security guard and tell them you are here for an infusion. They will direct you to the front entrance where we will come and get you.  For questions call [phone].  Thanks

## 2019-10-29 NOTE — Discharge Instructions (Signed)
You are scheduled for an outpatient infusion of Remdesivir at 10:00 AM on Tuesday 3/9.  Please report to [facility] at [address].  Drive to the security guard and tell them you are here for an infusion. They will direct you to the front entrance where we will come and get you.  For questions call [phone].  Thanks

## 2019-10-30 ENCOUNTER — Ambulatory Visit (HOSPITAL_COMMUNITY)
Admission: RE | Admit: 2019-10-30 | Discharge: 2019-10-30 | Disposition: A | Discharge status: Home / Self Care | Payer: Managed Care, Other (non HMO) | Source: Ambulatory Visit | Attending: Pulmonary Disease | Admitting: Pulmonary Disease

## 2019-10-30 DIAGNOSIS — Z20822 Contact with and (suspected) exposure to covid-19: Secondary | ICD-10-CM | POA: Diagnosis not present

## 2019-10-30 MED ORDER — FAMOTIDINE IN NACL 20-0.9 MG/50ML-% IV SOLN: 20.0000 mg | Freq: Once | INTRAVENOUS | Status: DC | PRN

## 2019-10-30 MED ORDER — SODIUM CHLORIDE 0.9 % IV SOLN: INTRAVENOUS | Status: DC | PRN

## 2019-10-30 MED ORDER — SODIUM CHLORIDE 0.9 % IV SOLN
100.0000 mg | Freq: Once | INTRAVENOUS | Status: AC
  Administered 2019-10-30: 100 mg via INTRAVENOUS

## 2019-10-30 MED ORDER — DIPHENHYDRAMINE HCL 50 MG/ML IJ SOLN: 50.0000 mg | Freq: Once | INTRAMUSCULAR | Status: DC | PRN

## 2019-10-30 MED ORDER — METHYLPREDNISOLONE SODIUM SUCC 125 MG IJ SOLR: 125.0000 mg | Freq: Once | INTRAMUSCULAR | Status: DC | PRN

## 2019-10-30 MED ORDER — EPINEPHRINE 0.3 MG/0.3ML IJ SOAJ: 0.3000 mg | Freq: Once | INTRAMUSCULAR | Status: DC | PRN

## 2019-10-30 MED ORDER — ALBUTEROL SULFATE HFA 108 (90 BASE) MCG/ACT IN AERS: 2.0000 | INHALATION_SPRAY | Freq: Once | RESPIRATORY_TRACT | Status: DC | PRN

## 2019-10-30 NOTE — Progress Notes (Signed)
  Diagnosis: COVID-19  Physician: Dr. Joya Gaskins  Procedure: [facility] Med: remdesivir infusion.  Complications: No immediate complications noted.  Discharge: Discharged home   [place] 10/30/2019

## 2019-10-31 LAB — CULTURE, BLOOD (ROUTINE X 2)
Culture: NO GROWTH
Culture: NO GROWTH
Special Requests: ADEQUATE
Special Requests: ADEQUATE

## 2019-11-08 ENCOUNTER — Other Ambulatory Visit: Payer: Self-pay

## 2020-02-27 ENCOUNTER — Other Ambulatory Visit: Payer: Managed Care, Other (non HMO)

## 2020-02-27 ENCOUNTER — Other Ambulatory Visit: Payer: Self-pay

## 2020-02-27 DIAGNOSIS — E1159 Type 2 diabetes mellitus with other circulatory complications: Secondary | ICD-10-CM

## 2020-02-27 DIAGNOSIS — Z008 Encounter for other general examination: Secondary | ICD-10-CM | POA: Diagnosis not present

## 2020-02-28 LAB — MICROALBUMIN / CREATININE URINE RATIO
Creatinine, Urine: 84.1 mg/dL
Microalb/Creat Ratio: 4 mg/g creat (ref 0–29)
Microalbumin, Urine: 3.2 ug/mL

## 2020-02-29 ENCOUNTER — Ambulatory Visit: Payer: Managed Care, Other (non HMO) | Admitting: Medical

## 2020-02-29 ENCOUNTER — Other Ambulatory Visit: Payer: Self-pay

## 2020-02-29 VITALS — BP 130/82 | HR 82 | Temp 98.0°F | Resp 16 | Ht 72.0 in | Wt 248.0 lb

## 2020-02-29 DIAGNOSIS — R7309 Other abnormal glucose: Secondary | ICD-10-CM | POA: Diagnosis not present

## 2020-02-29 DIAGNOSIS — Z008 Encounter for other general examination: Secondary | ICD-10-CM | POA: Diagnosis not present

## 2020-02-29 DIAGNOSIS — R4589 Other symptoms and signs involving emotional state: Secondary | ICD-10-CM

## 2020-02-29 LAB — BASIC METABOLIC PANEL
BUN/Creatinine Ratio: 12 (ref 9–20)
BUN: 12 mg/dL (ref 6–24)
CO2: 20 mmol/L (ref 20–29)
Calcium: 10.5 mg/dL — ABNORMAL HIGH (ref 8.7–10.2)
Chloride: 103 mmol/L (ref 96–106)
Creatinine, Ser: 1.03 mg/dL (ref 0.76–1.27)
GFR calc Af Amer: 93 mL/min/{1.73_m2} (ref >59)
GFR calc non Af Amer: 81 mL/min/{1.73_m2} (ref >59)
Glucose: 149 mg/dL — ABNORMAL HIGH (ref 65–99)
Potassium: 4.2 mmol/L (ref 3.5–5.2)
Sodium: 141 mmol/L (ref 134–144)

## 2020-02-29 LAB — LIPID PANEL
Chol/HDL Ratio: 4.3 ratio (ref 0.0–5.0)
Cholesterol, Total: 160 mg/dL (ref 100–199)
HDL: 37 mg/dL — ABNORMAL LOW (ref >39)
LDL Chol Calc (NIH): 91 mg/dL (ref 0–99)
Triglycerides: 184 mg/dL — ABNORMAL HIGH (ref 0–149)
VLDL Cholesterol Cal: 32 mg/dL (ref 5–40)

## 2020-02-29 LAB — VITAMIN D 25 HYDROXY (VIT D DEFICIENCY, FRACTURES): Vit D, 25-Hydroxy: 37.6 ng/mL (ref 30.0–100.0)

## 2020-02-29 LAB — HGB A1C W/O EAG: Hgb A1c MFr Bld: 9.3 % — ABNORMAL HIGH (ref 4.8–5.6)

## 2020-02-29 NOTE — Progress Notes (Signed)
Subjective:    Patient ID: Christopher Dennis, male    DOB: 16-Mar-1966, 54 y.o.   MRN: [id number]  HPI 54 yo male in non acute distress, here for Biometric screening today. No complaints.   Blood pressure 130/82, pulse 82, temperature 98 F (36.7 C), temperature source Oral, resp. rate 16, height 6' (1.829 m), weight 248 lb (112.5 kg), SpO2 98 %.  Review of Systems  Psychiatric/Behavioral: Negative for agitation, behavioral problems, confusion, decreased concentration, dysphoric mood, hallucinations, self-injury, sleep disturbance and suicidal ideas. The patient is not nervous/anxious and is not hyperactive.        Patient says he is having difficulty with sadness due to family members death and recent family illness with diagnosis of cancer.  Patient denies any suicidal thoughts or self injury. All other systems reviewed and are negative. History of Covid-19 infection late Feb. 2021 hospitalized till March 24th. Using Albuterol MDI occasionally.  No Known Allergies  Current Outpatient Medications:  .  empagliflozin (JARDIANCE) 25 MG TABS tablet, Take 25 mg by mouth daily. , Disp: , Rfl:  .  albuterol (VENTOLIN HFA) 108 (90 Base) MCG/ACT inhaler, Inhale 2 puffs into the lungs every 6 (six) hours as needed for wheezing or shortness of breath. (Patient not taking: Reported on 02/29/2020), Disp: 6.7 g, Rfl: 0 .  guaiFENesin-dextromethorphan (ROBITUSSIN DM) 100-10 MG/5ML syrup, Take 5 mLs by mouth every 4 (four) hours as needed for cough. (Patient not taking: Reported on 02/29/2020), Disp: 118 mL, Rfl: 0 .  Insulin Degludec-Liraglutide (XULTOPHY) 100-3.6 UNIT-MG/ML SOPN, Inject 50 Units into the skin at bedtime. , Disp: , Rfl:  .  lisinopril (PRINIVIL,ZESTRIL) 20 MG tablet, Take 20 mg by mouth daily. , Disp: , Rfl:  .  metFORMIN (GLUCOPHAGE) 1000 MG tablet, Take 1,000 mg by mouth 2 (two) times daily. , Disp: , Rfl:  .  ONE TOUCH ULTRA TEST test strip, TEST AT LEAST TID, Disp: 100 each, Rfl:  12 .  pioglitazone (ACTOS) 30 MG tablet, Take 30 mg by mouth daily. , Disp: , Rfl:  .  simvastatin (ZOCOR) 40 MG tablet, Take 40 mg by mouth at bedtime. , Disp: , Rfl: 6 .  Vitamin D, Ergocalciferol, (DRISDOL) 1.25 MG (50000 UT) CAPS capsule, Take 50,000 Units by mouth once a week. , Disp: , Rfl:   Past Medical History:  Diagnosis Date  . Abnormal LFTs 12/04/2012  . Arteriosclerosis of coronary artery 12/04/2012  . Back ache 12/04/2012  . BP (high blood pressure) 12/04/2012  . Clinical depression 12/04/2012  . DDD (degenerative disc disease), lumbosacral 12/11/2012  . Diabetes ([place])   . Displacement of lumbar intervertebral disc without myelopathy 12/11/2012  . HLD (hyperlipidemia) 12/04/2012  . Lumbar canal stenosis 12/11/2012  . Neuropathy 12/04/2012  . Sleep apnea    CPAP - hasn't been using, no sure if it works  . 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 BACK SURGERY  2000  . COLONOSCOPY WITH PROPOFOL N/A 05/12/2015   Procedure: COLONOSCOPY WITH PROPOFOL;  Surgeon: Lucilla Lame, MD;  Location: [place];  Service: Endoscopy;  Laterality: N/A;  . CORONARY ANGIOPLASTY WITH STENT PLACEMENT  06/17/2011  . ESOPHAGOGASTRODUODENOSCOPY (EGD) WITH PROPOFOL N/A 05/12/2015   Procedure: ESOPHAGOGASTRODUODENOSCOPY (EGD) WITH PROPOFOL;  Surgeon: Lucilla Lame, MD;  Location: [place];  Service: Endoscopy;  Laterality: N/A;  CPAP  . KNEE ARTHROSCOPY Right 09/19/2014  . Laminectomy posterior cervicle decomp    . laminectomy posterior  lumbar facetectomy & Foraminotomy w/decomp  12/15/2012  . meniscus tear  2006  . partial medial and extensive lateral meniscectomy    . POLYPECTOMY  05/12/2015   Procedure: POLYPECTOMY;  Surgeon: Lucilla Lame, MD;  Location: [place];  Service: Endoscopy;;   Family History  Problem Relation Age of Onset  . Diabetes Mother   . COPD Mother   . Alcohol abuse Father   . Asthma Sister     Social History   Socioeconomic History  . Marital status: Married    Spouse name: Not on file  . Number of children: Not on file  . Years of education: Not on file  . Highest education level: Not on file  Occupational History  . Not on file  Tobacco Use  . Smoking status: Former Smoker    Quit date: 01/21/2009    Years since quitting: 11.1  . Smokeless tobacco: Current User    Types: Snuff  Substance and Sexual Activity  . Alcohol use: Yes    Alcohol/week: 0.0 standard drinks    Comment: rarely - 1 beer/month  . Drug use: No  . Sexual activity: Not on file  Other Topics Concern  . Not on file  Social History Narrative  . Not on file   Social Determinants of Health   Financial Resource Strain:   . Difficulty of Paying Living Expenses:   Food Insecurity:   . Worried About Charity fundraiser in the Last Year:   . Arboriculturist in the Last Year:   Transportation Needs:   . Film/video editor (Medical):   Marland Kitchen Lack of Transportation (Non-Medical):   Physical Activity:   . Days of Exercise per Week:   . Minutes of Exercise per Session:   Stress:   . Feeling of Stress :   Social Connections:   . Frequency of Communication with Friends and Family:   . Frequency of Social Gatherings with Friends and Family:   . Attends Religious Services:   . Active Member of Clubs or Organizations:   . Attends Archivist Meetings:   Marland Kitchen Marital Status:   Intimate Partner Violence:   . Fear of Current or Ex-Partner:   . Emotionally Abused:   Marland Kitchen Physically Abused:   . Sexually Abused:        Objective:   Physical Exam Vitals and nursing note reviewed.  Constitutional:      Appearance: Normal appearance.  HENT:     Head: Normocephalic and atraumatic.     Right Ear: Tympanic membrane, ear canal and external ear normal.     Left Ear: Tympanic membrane, ear canal and external ear normal.     Nose: Nose normal.     Mouth/Throat:     Mouth: Mucous membranes are moist.      Pharynx: Oropharynx is clear.  Eyes:     Extraocular Movements: Extraocular movements intact.     Conjunctiva/sclera: Conjunctivae normal.     Pupils: Pupils are equal, round, and reactive to light.  Cardiovascular:     Rate and Rhythm: Normal rate and regular rhythm.     Pulses: Normal pulses.     Heart sounds: Normal heart sounds. No murmur heard.  No friction rub. No gallop.   Pulmonary:     Effort: Pulmonary effort is normal.     Breath sounds: Normal breath sounds.  Abdominal:     General: Bowel sounds are normal.     Palpations: Abdomen is soft.  Tenderness: There is no right CVA tenderness or left CVA tenderness.  Musculoskeletal:        General: Normal range of motion.     Cervical back: Normal range of motion and neck supple. No rigidity or tenderness.  Lymphadenopathy:     Cervical: No cervical adenopathy.  Skin:    General: Skin is warm and dry.     Capillary Refill: Capillary refill takes less than 2 seconds.  Neurological:     General: No focal deficit present.     Mental Status: He is alert and oriented to person, place, and time. Mental status is at baseline.  Psychiatric:        Mood and Affect: Mood normal.        Behavior: Behavior normal.        Thought Content: Thought content normal.        Judgment: Judgment normal.           Assessment & Plan:  Encounter for Biometric screening  Encounter for other general examination Feeling of sadness Elevated  Hemoglobin A1C, labs were done per his Doctor. History Diabetes Reviewed with patient the importance of diet and exercise, he states he has not been doing well with his diet due to stress levels. Encouraged patient to follow up with Dr. Hall Busing for depression possible medicine and/or counseling. Patient open to receiving information about Depression in MyChart. Patient verbalizes understanding of information and has no questions at discharge.

## 2020-02-29 NOTE — Patient Instructions (Signed)
Information sent through MyChart.

## 2020-05-05 ENCOUNTER — Other Ambulatory Visit: Payer: Self-pay

## 2020-05-05 ENCOUNTER — Other Ambulatory Visit: Payer: Managed Care, Other (non HMO)

## 2020-05-05 DIAGNOSIS — E1165 Type 2 diabetes mellitus with hyperglycemia: Secondary | ICD-10-CM

## 2020-05-06 LAB — HGB A1C W/O EAG: Hgb A1c MFr Bld: 9.2 % — ABNORMAL HIGH (ref 4.8–5.6)

## 2020-05-28 ENCOUNTER — Other Ambulatory Visit: Payer: Managed Care, Other (non HMO)

## 2020-05-28 ENCOUNTER — Other Ambulatory Visit: Payer: Self-pay

## 2021-03-08 ENCOUNTER — Encounter: Payer: Self-pay | Admitting: Internal Medicine

## 2021-03-08 ENCOUNTER — Other Ambulatory Visit: Payer: Self-pay

## 2021-03-08 ENCOUNTER — Observation Stay
Admission: EM | Admit: 2021-03-08 | Discharge: 2021-03-09 | Disposition: A | Discharge status: Home / Self Care | Payer: Managed Care, Other (non HMO) | Attending: Internal Medicine | Admitting: Internal Medicine

## 2021-03-08 DIAGNOSIS — E119 Type 2 diabetes mellitus without complications: Secondary | ICD-10-CM

## 2021-03-08 DIAGNOSIS — Z87891 Personal history of nicotine dependence: Secondary | ICD-10-CM | POA: Diagnosis not present

## 2021-03-08 DIAGNOSIS — Z833 Family history of diabetes mellitus: Secondary | ICD-10-CM | POA: Diagnosis not present

## 2021-03-08 DIAGNOSIS — R079 Chest pain, unspecified: Secondary | ICD-10-CM | POA: Diagnosis present

## 2021-03-08 DIAGNOSIS — I4891 Unspecified atrial fibrillation: Principal | ICD-10-CM | POA: Diagnosis present

## 2021-03-08 DIAGNOSIS — E669 Obesity, unspecified: Secondary | ICD-10-CM | POA: Diagnosis present

## 2021-03-08 DIAGNOSIS — I2510 Atherosclerotic heart disease of native coronary artery without angina pectoris: Secondary | ICD-10-CM | POA: Diagnosis present

## 2021-03-08 DIAGNOSIS — Z794 Long term (current) use of insulin: Secondary | ICD-10-CM | POA: Insufficient documentation

## 2021-03-08 DIAGNOSIS — I10 Essential (primary) hypertension: Secondary | ICD-10-CM | POA: Diagnosis present

## 2021-03-08 DIAGNOSIS — Z79899 Other long term (current) drug therapy: Secondary | ICD-10-CM | POA: Diagnosis not present

## 2021-03-08 DIAGNOSIS — G4733 Obstructive sleep apnea (adult) (pediatric): Secondary | ICD-10-CM | POA: Diagnosis present

## 2021-03-08 DIAGNOSIS — Z955 Presence of coronary angioplasty implant and graft: Secondary | ICD-10-CM

## 2021-03-08 DIAGNOSIS — F32A Depression, unspecified: Secondary | ICD-10-CM | POA: Diagnosis present

## 2021-03-08 DIAGNOSIS — Z7984 Long term (current) use of oral hypoglycemic drugs: Secondary | ICD-10-CM

## 2021-03-08 DIAGNOSIS — E785 Hyperlipidemia, unspecified: Secondary | ICD-10-CM | POA: Diagnosis present

## 2021-03-08 DIAGNOSIS — Z20822 Contact with and (suspected) exposure to covid-19: Secondary | ICD-10-CM | POA: Insufficient documentation

## 2021-03-08 DIAGNOSIS — E1142 Type 2 diabetes mellitus with diabetic polyneuropathy: Secondary | ICD-10-CM | POA: Diagnosis present

## 2021-03-08 DIAGNOSIS — R0789 Other chest pain: Secondary | ICD-10-CM

## 2021-03-08 DIAGNOSIS — I480 Paroxysmal atrial fibrillation: Secondary | ICD-10-CM | POA: Diagnosis present

## 2021-03-08 LAB — CBC WITH DIFFERENTIAL/PLATELET
Abs Immature Granulocytes: 0.03 10*3/uL (ref 0.00–0.07)
Basophils Absolute: 0 10*3/uL (ref 0.0–0.1)
Basophils Relative: 0 %
Eosinophils Absolute: 0.1 10*3/uL (ref 0.0–0.5)
Eosinophils Relative: 2 %
HCT: 43.8 % (ref 39.0–52.0)
Hemoglobin: 14.8 g/dL (ref 13.0–17.0)
Immature Granulocytes: 0 %
Lymphocytes Relative: 33 %
Lymphs Abs: 2.5 10*3/uL (ref 0.7–4.0)
MCH: 30.6 pg (ref 26.0–34.0)
MCHC: 33.8 g/dL (ref 30.0–36.0)
MCV: 90.7 fL (ref 80.0–100.0)
Monocytes Absolute: 0.5 10*3/uL (ref 0.1–1.0)
Monocytes Relative: 7 %
Neutro Abs: 4.5 10*3/uL (ref 1.7–7.7)
Neutrophils Relative %: 58 %
Platelets: 175 10*3/uL (ref 150–400)
RBC: 4.83 MIL/uL (ref 4.22–5.81)
RDW: 13.5 % (ref 11.5–15.5)
WBC: 7.7 10*3/uL (ref 4.0–10.5)
nRBC: 0 % (ref 0.0–0.2)

## 2021-03-08 LAB — CBG MONITORING, ED
Glucose-Capillary: 135 mg/dL — ABNORMAL HIGH (ref 70–99)
Glucose-Capillary: 158 mg/dL — ABNORMAL HIGH (ref 70–99)
Glucose-Capillary: 158 mg/dL — ABNORMAL HIGH (ref 70–99)
Glucose-Capillary: 151 mg/dL — ABNORMAL HIGH (ref 70–99)

## 2021-03-08 LAB — HEMOGLOBIN A1C
Hgb A1c MFr Bld: 6.7 % — ABNORMAL HIGH (ref 4.8–5.6)
Mean Plasma Glucose: 145.59 mg/dL

## 2021-03-08 LAB — COMPREHENSIVE METABOLIC PANEL
ALT: 30 U/L (ref 0–44)
AST: 47 U/L — ABNORMAL HIGH (ref 15–41)
Albumin: 4 g/dL (ref 3.5–5.0)
Alkaline Phosphatase: 49 U/L (ref 38–126)
Anion gap: 9 (ref 5–15)
BUN: 23 mg/dL — ABNORMAL HIGH (ref 6–20)
CO2: 24 mmol/L (ref 22–32)
Calcium: 9 mg/dL (ref 8.9–10.3)
Chloride: 106 mmol/L (ref 98–111)
Creatinine, Ser: 0.87 mg/dL (ref 0.61–1.24)
GFR, Estimated: >60 mL/min (ref >60)
Glucose, Bld: 139 mg/dL — ABNORMAL HIGH (ref 70–99)
Potassium: 3.9 mmol/L (ref 3.5–5.1)
Sodium: 139 mmol/L (ref 135–145)
Total Bilirubin: 0.8 mg/dL (ref 0.3–1.2)
Total Protein: 6.9 g/dL (ref 6.5–8.1)

## 2021-03-08 LAB — PROTIME-INR
INR: 1 (ref 0.8–1.2)
Prothrombin Time: 13.3 seconds (ref 11.4–15.2)

## 2021-03-08 LAB — RESP PANEL BY RT-PCR (FLU A&B, COVID) ARPGX2
Influenza A by PCR: NEGATIVE
Influenza B by PCR: NEGATIVE
SARS Coronavirus 2 by RT PCR: NEGATIVE

## 2021-03-08 LAB — HEPARIN LEVEL (UNFRACTIONATED)
Heparin Unfractionated: 0.66 IU/mL (ref 0.30–0.70)
Heparin Unfractionated: 0.74 IU/mL — ABNORMAL HIGH (ref 0.30–0.70)

## 2021-03-08 LAB — APTT: aPTT: 29 seconds (ref 24–36)

## 2021-03-08 LAB — TROPONIN I (HIGH SENSITIVITY)
Troponin I (High Sensitivity): 7 ng/L (ref <18)
Troponin I (High Sensitivity): 8 ng/L (ref <18)

## 2021-03-08 LAB — MAGNESIUM: Magnesium: 1.9 mg/dL (ref 1.7–2.4)

## 2021-03-08 MED ORDER — ALBUTEROL SULFATE (2.5 MG/3ML) 0.083% IN NEBU: 3.0000 mL | INHALATION_SOLUTION | RESPIRATORY_TRACT | Status: DC | PRN

## 2021-03-08 MED ORDER — HEPARIN (PORCINE) 25000 UT/250ML-% IV SOLN
1400.0000 [IU]/h | INTRAVENOUS | Status: DC
  Administered 2021-03-08: 1500 [IU]/h via INTRAVENOUS
  Filled 2021-03-08 (×2): qty 250

## 2021-03-08 MED ORDER — DILTIAZEM HCL 60 MG PO TABS
120.0000 mg | ORAL_TABLET | Freq: Once | ORAL | Status: AC
  Administered 2021-03-08: 120 mg via ORAL
  Filled 2021-03-08: qty 2

## 2021-03-08 MED ORDER — DILTIAZEM HCL-DEXTROSE 125-5 MG/125ML-% IV SOLN (PREMIX)
5.0000 mg/h | INTRAVENOUS | Status: DC
  Administered 2021-03-08: 5 mg/h via INTRAVENOUS
  Filled 2021-03-08: qty 125

## 2021-03-08 MED ORDER — HYDRALAZINE HCL 20 MG/ML IJ SOLN: 5.0000 mg | INTRAMUSCULAR | Status: DC | PRN

## 2021-03-08 MED ORDER — INSULIN ASPART 100 UNIT/ML IJ SOLN: 0.0000 [IU] | Freq: Every day | INTRAMUSCULAR | Status: DC

## 2021-03-08 MED ORDER — DILTIAZEM HCL 25 MG/5ML IV SOLN
15.0000 mg | INTRAVENOUS | Status: AC
  Administered 2021-03-08: 15 mg via INTRAVENOUS
  Filled 2021-03-08: qty 5

## 2021-03-08 MED ORDER — INSULIN ASPART 100 UNIT/ML IJ SOLN
0.0000 [IU] | Freq: Three times a day (TID) | INTRAMUSCULAR | Status: DC
  Administered 2021-03-08: 2 [IU] via SUBCUTANEOUS
  Administered 2021-03-08: 1 [IU] via SUBCUTANEOUS
  Administered 2021-03-08 – 2021-03-09 (×2): 2 [IU] via SUBCUTANEOUS
  Filled 2021-03-08 (×4): qty 1

## 2021-03-08 MED ORDER — SODIUM CHLORIDE 0.9 % IV BOLUS
500.0000 mL | Freq: Once | INTRAVENOUS | Status: AC
  Administered 2021-03-08: 500 mL via INTRAVENOUS

## 2021-03-08 MED ORDER — ASPIRIN 81 MG PO CHEW: 324.0000 mg | CHEWABLE_TABLET | Freq: Once | ORAL | Status: DC

## 2021-03-08 MED ORDER — HEPARIN BOLUS VIA INFUSION
5000.0000 [IU] | Freq: Once | INTRAVENOUS | Status: AC
  Administered 2021-03-08: 5000 [IU] via INTRAVENOUS
  Filled 2021-03-08: qty 5000

## 2021-03-08 MED ORDER — ACETAMINOPHEN 325 MG PO TABS
650.0000 mg | ORAL_TABLET | Freq: Four times a day (QID) | ORAL | Status: DC | PRN
  Administered 2021-03-08: 650 mg via ORAL
  Filled 2021-03-08: qty 2

## 2021-03-08 MED ORDER — ONDANSETRON HCL 4 MG/2ML IJ SOLN: 4.0000 mg | Freq: Three times a day (TID) | INTRAMUSCULAR | Status: DC | PRN

## 2021-03-08 MED ORDER — INSULIN GLARGINE 100 UNIT/ML ~~LOC~~ SOLN
25.0000 [IU] | Freq: Every day | SUBCUTANEOUS | Status: DC
  Filled 2021-03-08 (×2): qty 0.25

## 2021-03-08 NOTE — Consult Note (Signed)
CARDIOLOGY CONSULT NOTE               Patient ID: Christopher Dennis MRN: [id number] DOB/AGE: Mar 18, 1966 55 y.o.  Admit date: 03/08/2021 Referring Physician Ivor Costa MD hospitalist Primary Physician Benita Stabile primary Primary Cardiologist none Reason for Consultation atrial fibrillation rapid ventricular response new  HPI: History of hypertension hyperlipidemia diabetes coronary disease history of PCI and stent obstructive sleep apnea mild obesity presented with shortness of breath palpitations tachycardia which is new onset patient was fine yesterday but woke up about 3 AM with rapid heart racing sweating weakness fatigue shortness of breath sweating patient denies any discrete chest pain or tightness no fever chills or sweats no nausea vomiting or diarrhea.  EMS was called and patient was brought to the emergency room reportedly heart rate of 1 80-200 patient was given volume IV metoprolol and then placed on Cardizem in emergency room to control his heart rate patient had a negative COVID electrolytes are essentially unremarkable blood pressure was low at the time systolic of 90 but now he is rate controlled on IV diltiazem no chest pain nonspecific EKG changes no previous atrial fibrillation  Review of systems complete and found to be negative unless listed above     Past Medical History:  Diagnosis Date   Abnormal LFTs 12/04/2012   Arteriosclerosis of coronary artery 12/04/2012   Back ache 12/04/2012   BP (high blood pressure) 12/04/2012   Clinical depression 12/04/2012   DDD (degenerative disc disease), lumbosacral 12/11/2012   Diabetes ([place])    Displacement of lumbar intervertebral disc without myelopathy 12/11/2012   HLD (hyperlipidemia) 12/04/2012   Lumbar canal stenosis 12/11/2012   Neuropathy 12/04/2012   Sleep apnea    CPAP - hasn't been using, no sure if it works   Engineer, maintenance and lumbosacral neuritis 12/11/2012    Past Surgical History:  Procedure Laterality Date   BACK  SURGERY  2000   COLONOSCOPY WITH PROPOFOL N/A 05/12/2015   Procedure: COLONOSCOPY WITH PROPOFOL;  Surgeon: Lucilla Lame, MD;  Location: [place];  Service: Endoscopy;  Laterality: N/A;   CORONARY ANGIOPLASTY WITH STENT PLACEMENT  06/17/2011   ESOPHAGOGASTRODUODENOSCOPY (EGD) WITH PROPOFOL N/A 05/12/2015   Procedure: ESOPHAGOGASTRODUODENOSCOPY (EGD) WITH PROPOFOL;  Surgeon: Lucilla Lame, MD;  Location: [place];  Service: Endoscopy;  Laterality: N/A;  CPAP   KNEE ARTHROSCOPY Right 09/19/2014   Laminectomy posterior cervicle decomp     laminectomy posterior lumbar facetectomy & Foraminotomy w/decomp  12/15/2012   meniscus tear  2006   partial medial and extensive lateral meniscectomy     POLYPECTOMY  05/12/2015   Procedure: POLYPECTOMY;  Surgeon: Lucilla Lame, MD;  Location: [employer] [facility];  Service: Endoscopy;;    (Not in a hospital admission)  Social History   Socioeconomic History   Marital status: Married    Spouse name: Not on file   Number of children: Not on file   Years of education: Not on file   Highest education level: Not on file  Occupational History   Not on file  Tobacco Use   Smoking status: Former   Smokeless tobacco: Current    Types: Snuff  Substance and Sexual Activity   Alcohol use: Yes    Alcohol/week: 0.0 standard drinks    Comment: rarely - 1 beer/month   Drug use: No   Sexual activity: Not on file  Other Topics Concern   Not on file  Social History Narrative   Not on file   Social  Determinants of Health   Financial Resource Strain: Not on file  Food Insecurity: Not on file  Transportation Needs: Not on file  Physical Activity: Not on file  Stress: Not on file  Social Connections: Not on file  Intimate Partner Violence: Not on file    Family History  Problem Relation Age of Onset   Diabetes Mother    COPD Mother    Alcohol abuse Father    Asthma Sister       Review of systems complete and found to be negative unless  listed above      PHYSICAL EXAM  General: Well developed, well nourished, in no acute distress HEENT:  Normocephalic and atramatic Neck:  No JVD.  Lungs: Clear bilaterally to auscultation and percussion. Heart: Irregular irregular. Normal S1 and S2 without gallops or murmurs.  Abdomen: Bowel sounds are positive, abdomen soft and non-tender  Msk:  Back normal, normal gait. Normal strength and tone for age. Extremities: No clubbing, cyanosis or edema.   Neuro: Alert and oriented X 3. Psych:  Good affect, responds appropriately  Labs:   Lab Results  Component Value Date   WBC 7.7 03/08/2021   HGB 14.8 03/08/2021   HCT 43.8 03/08/2021   MCV 90.7 03/08/2021   PLT 175 03/08/2021    Recent Labs  Lab 03/08/21 0415  NA 139  K 3.9  CL 106  CO2 24  BUN 23*  CREATININE 0.87  CALCIUM 9.0  PROT 6.9  BILITOT 0.8  ALKPHOS 49  ALT 30  AST 47*  GLUCOSE 139*   No results found for: CKTOTAL, CKMB, CKMBINDEX, TROPONINI  Lab Results  Component Value Date   CHOL 160 02/27/2020   CHOL 176 02/07/2019   CHOL 126 06/29/2018   Lab Results  Component Value Date   HDL 37 (L) 02/27/2020   HDL 45 02/07/2019   HDL 32 (L) 06/29/2018   Lab Results  Component Value Date   LDLCALC 91 02/27/2020   LDLCALC 106 (H) 02/07/2019   LDLCALC 56 06/29/2018   Lab Results  Component Value Date   TRIG 184 (H) 02/27/2020   TRIG 159 (H) 10/26/2019   TRIG 127 02/07/2019   Lab Results  Component Value Date   CHOLHDL 4.3 02/27/2020   CHOLHDL 3.9 06/29/2018   CHOLHDL 3.8 11/22/2017   No results found for: LDLDIRECT    Radiology: No results found.  EKG: Atrial fibrillation rate controlled currently at 90 nonspecific ST-T wave changes  ASSESSMENT AND PLAN:  Atrial fibrillation new onset Coronary artery disease Obstructive sleep apnea PCI and stent Shortness of breath Hyperlipidemia Diabetes Palpitations . Plan Agree with diltiazem for rate control we will try to transition to  p.o. Anticoagulation from heparin to Eliquis anticoagulation 5 mg twice a day Coronary disease previous PCI and stent recommend beta-blocker ACE or ARB statin aspirin Continue Zocor for hyperlipidemia statin therapy Diabetes type 2 uncomplicated with elevated A1c continue further aggressive diabetes management Obesity recommend weight loss exercise portion control Recommend consider sleep study for evaluation of possible sleep apnea CPAP if indicated   Signed: Yolonda Kida MD 03/08/2021, 10:42 AM

## 2021-03-08 NOTE — ED Notes (Signed)
Pt given cup of ice per request  

## 2021-03-08 NOTE — Progress Notes (Signed)
ANTICOAGULATION CONSULT NOTE   Pharmacy Consult for heparin infusion Indication: atrial fibrillation  No Known Allergies  Patient Measurements: Height: 6' (182.9 cm) Weight: 108.9 kg (240 lb) IBW/kg (Calculated) : 77.6 Heparin Dosing Weight: 100.6 kg  Vital Signs: Temp: 97.6 F (36.4 C) (07/17 0758) Temp Source: Oral (07/17 0758) BP: 90/75 (07/17 1145) Pulse Rate: 73 (07/17 1145)  Labs: Recent Labs    03/08/21 0415 03/08/21 0655 03/08/21 1203  HGB 14.8  --   --   HCT 43.8  --   --   PLT 175  --   --   APTT 29  --   --   LABPROT 13.3  --   --   INR 1.0  --   --   HEPARINUNFRC  --   --  0.66  CREATININE 0.87  --   --   TROPONINIHS 7 8  --      Estimated Creatinine Clearance: 119.4 mL/min (by C-G formula based on SCr of 0.87 mg/dL).   Medical History: Past Medical History:  Diagnosis Date   Abnormal LFTs 12/04/2012   Arteriosclerosis of coronary artery 12/04/2012   Back ache 12/04/2012   BP (high blood pressure) 12/04/2012   Clinical depression 12/04/2012   DDD (degenerative disc disease), lumbosacral 12/11/2012   Diabetes ([place])    Displacement of lumbar intervertebral disc without myelopathy 12/11/2012   HLD (hyperlipidemia) 12/04/2012   Lumbar canal stenosis 12/11/2012   Neuropathy 12/04/2012   Sleep apnea    CPAP - hasn't been using, no sure if it works   Engineer, maintenance and lumbosacral neuritis 12/11/2012    Assessment: Pt is 55 yo male arriving via EMS c/o chest pain with a fib with RVR.  7/17 1203  HL=0.66  Cont rate of 1500 units/hr  Goal of Therapy:  Heparin level 0.3-0.7 units/ml Monitor platelets by anticoagulation protocol: Yes   Plan:  7/17 1203  HL=0.66 Therapeutic.  Continue current rate of 1500 units/hr. Check confirmatory level in 6 hrs. F/u CBC with am labs  Chinita Greenland PharmD Clinical Pharmacist 03/08/2021

## 2021-03-08 NOTE — Progress Notes (Signed)
ANTICOAGULATION CONSULT NOTE   Pharmacy Consult for heparin infusion Indication: atrial fibrillation  No Known Allergies  Patient Measurements: Height: 6' (182.9 cm) Weight: 108.9 kg (240 lb) IBW/kg (Calculated) : 77.6 Heparin Dosing Weight: 100.6 kg  Vital Signs: Temp: 97.6 F (36.4 C) (07/17 0758) Temp Source: Oral (07/17 0758) BP: 110/74 (07/17 1445) Pulse Rate: 70 (07/17 1445)  Labs: Recent Labs    03/08/21 0415 03/08/21 0655 03/08/21 1203  HGB 14.8  --   --   HCT 43.8  --   --   PLT 175  --   --   APTT 29  --   --   LABPROT 13.3  --   --   INR 1.0  --   --   HEPARINUNFRC  --   --  0.66  CREATININE 0.87  --   --   TROPONINIHS 7 8  --      Estimated Creatinine Clearance: 119.4 mL/min (by C-G formula based on SCr of 0.87 mg/dL).   Medical History: Past Medical History:  Diagnosis Date   Abnormal LFTs 12/04/2012   Arteriosclerosis of coronary artery 12/04/2012   Back ache 12/04/2012   BP (high blood pressure) 12/04/2012   Clinical depression 12/04/2012   DDD (degenerative disc disease), lumbosacral 12/11/2012   Diabetes ([place])    Displacement of lumbar intervertebral disc without myelopathy 12/11/2012   HLD (hyperlipidemia) 12/04/2012   Lumbar canal stenosis 12/11/2012   Neuropathy 12/04/2012   Sleep apnea    CPAP - hasn't been using, no sure if it works   Engineer, maintenance and lumbosacral neuritis 12/11/2012    Assessment: Pt is 56 yo male arriving via EMS c/o chest pain with a fib with RVR.  7/17 1203  HL=0.66  Cont rate of 1500 units/hr 7/17 1800  HL =0.74   1500 > 1400 units/hr    Goal of Therapy:  Heparin level 0.3-0.7 units/ml Monitor platelets by anticoagulation protocol: Yes   Plan:  7/17 1800  HL=0.74 supratherapeutic.  Decrease Heparin infusion to 1400 units/hr. Check heparin level in 6 hrs following rate change F/u CBC with am labs  Dorothe Pea, PharmD, BCPS Clinical Pharmacist   03/08/2021

## 2021-03-08 NOTE — H&P (Signed)
History and Physical    Christopher Dennis NWG:[id number] DOB: 1966-07-28 DOA: 03/08/2021  Referring MD/NP/PA:   PCP: Albina Billet, MD   Patient coming from:  The patient is coming from home.  At baseline, pt is independent for most of ADL.        Chief Complaint: SOB and palpitation  HPI: [name] is a 55 y.o. male with medical history significant of HTN, HLD, DM, CAD with stent, OSA, depression, chronic back pain, who presents with SOB and palpitation.  Pt states that he had a normal day yesterday, but he was waken up from sleep by acute onset of shortness of breath, palpitation and heart racing, associated with cold sweating.  Patient denies chest pain and cough.  No fever or chills.  No nausea, vomiting, diarrhea or abdominal pain.  No symptoms of UTI.  When EMS arrived, pt was found to have atrial fibrillation with heart rate 180-200. They gave him about 250 mL of normal saline and metoprolol 5 mg IV which brought his rate down substantially. Pt was given 15 mg of IV Cardizem, and 120 mg of oral Cardizem in the ED.  His heart rate was initially down to 40s, then comes back to 130-140s when I saw patient in the ED.  Patient states that he does not have history of atrial fibrillation.  ED Course: pt was found to have WBC 7.7, pending COVID-19 test, INR 1.0, PTT 29, troponin level 7, 8, electrolytes renal function okay, magnesium 1.9, potassium 3.9, blood pressure 90/62, 101/80, RR 21, oxygen sat 97% on room air.  Patient is admitted to progressive bed as inpatient by accepting MD.  Review of Systems:   General: no fevers, chills, no body weight gain, fatigue HEENT: no blurry vision, hearing changes or sore throat Respiratory: has dyspnea, no coughing, wheezing CV: no chest pain, has palpitations GI: no nausea, vomiting, abdominal pain, diarrhea, constipation GU: no dysuria, burning on urination, increased urinary frequency, hematuria  Ext: no leg edema Neuro: no  unilateral weakness, numbness, or tingling, no vision change or hearing loss Skin: no rash, no skin tear. MSK: No muscle spasm, no deformity, no limitation of range of movement in spin Heme: No easy bruising.  Travel history: No recent long distant travel.  Allergy: No Known Allergies  Past Medical History:  Diagnosis Date   Abnormal LFTs 12/04/2012   Arteriosclerosis of coronary artery 12/04/2012   Back ache 12/04/2012   BP (high blood pressure) 12/04/2012   Clinical depression 12/04/2012   DDD (degenerative disc disease), lumbosacral 12/11/2012   Diabetes ([place])    Displacement of lumbar intervertebral disc without myelopathy 12/11/2012   HLD (hyperlipidemia) 12/04/2012   Lumbar canal stenosis 12/11/2012   Neuropathy 12/04/2012   Sleep apnea    CPAP - hasn't been using, no sure if it works   Engineer, maintenance and lumbosacral neuritis 12/11/2012    Past Surgical History:  Procedure Laterality Date   BACK SURGERY  2000   COLONOSCOPY WITH PROPOFOL N/A 05/12/2015   Procedure: COLONOSCOPY WITH PROPOFOL;  Surgeon: Lucilla Lame, MD;  Location: [place];  Service: Endoscopy;  Laterality: N/A;   CORONARY ANGIOPLASTY WITH STENT PLACEMENT  06/17/2011   ESOPHAGOGASTRODUODENOSCOPY (EGD) WITH PROPOFOL N/A 05/12/2015   Procedure: ESOPHAGOGASTRODUODENOSCOPY (EGD) WITH PROPOFOL;  Surgeon: Lucilla Lame, MD;  Location: [place];  Service: Endoscopy;  Laterality: N/A;  CPAP   KNEE ARTHROSCOPY Right 09/19/2014   Laminectomy posterior cervicle decomp     laminectomy posterior lumbar  facetectomy & Foraminotomy w/decomp  12/15/2012   meniscus tear  2006   partial medial and extensive lateral meniscectomy     POLYPECTOMY  05/12/2015   Procedure: POLYPECTOMY;  Surgeon: Lucilla Lame, MD;  Location: [place];  Service: Endoscopy;;    Social History:  reports that he has quit smoking. His smokeless tobacco use includes snuff. He reports current alcohol use. He reports that he does not use  drugs.  Family History:  Family History  Problem Relation Age of Onset   Diabetes Mother    COPD Mother    Alcohol abuse Father    Asthma Sister      Prior to Admission medications   Medication Sig Start Date End Date Taking? Authorizing Provider  empagliflozin (JARDIANCE) 25 MG TABS tablet Take 25 mg by mouth daily.    Yes [provider]  Insulin Degludec-Liraglutide (XULTOPHY) 100-3.6 UNIT-MG/ML SOPN Inject 50 Units into the skin at bedtime.    Yes [provider]  lisinopril (PRINIVIL,ZESTRIL) 20 MG tablet Take 20 mg by mouth daily.    Yes [provider]  metFORMIN (GLUCOPHAGE) 1000 MG tablet Take 1,000 mg by mouth 2 (two) times daily.    Yes [provider]  pioglitazone (ACTOS) 30 MG tablet Take 30 mg by mouth daily.    Yes [provider]  simvastatin (ZOCOR) 40 MG tablet Take 40 mg by mouth at bedtime.    Yes [provider]  Vitamin D, Ergocalciferol, (DRISDOL) 1.25 MG (50000 UT) CAPS capsule Take 50,000 Units by mouth once a week.    Yes [provider]  albuterol (VENTOLIN HFA) 108 (90 Base) MCG/ACT inhaler Inhale 2 puffs into the lungs every 6 (six) hours as needed for wheezing or shortness of breath. Patient not taking: No sig reported 10/22/19   Laban Emperor, PA-C  guaiFENesin-dextromethorphan (ROBITUSSIN DM) 100-10 MG/5ML syrup Take 5 mLs by mouth every 4 (four) hours as needed for cough. Patient not taking: No sig reported 10/29/19   Enzo Bi, MD  ONE [facility] ULTRA TEST test strip TEST AT LEAST TID Patient not taking: Reported on 03/08/2021 10/05/16   Versie Starks, PA-C  OZEMPIC, 0.25 OR 0.5 MG/DOSE, 2 MG/1.5ML SOPN Inject 0.5 mg into the skin. 03/05/21   [provider]    Physical Exam: Vitals:   03/08/21 0745 03/08/21 0758 03/08/21 0800 03/08/21 0810  BP: 92/71  (!) 108/94   Pulse: (!) 107  62 93  Resp:      Temp:  97.6 F (36.4 C)    TempSrc:  Oral    SpO2: 95%  97% 95%  Weight:       Height:       General: Not in acute distress HEENT:       Eyes: PERRL, EOMI, no scleral icterus.       ENT: No discharge from the ears and nose, no pharynx injection, no tonsillar enlargement.        Neck: No JVD, no bruit, no mass felt. Heme: No neck lymph node enlargement. Cardiac: S1/S2, irregularly irregular rhythm, no murmurs, No gallops or rubs. Respiratory: No rales, wheezing, rhonchi or rubs. GI: Soft, nondistended, nontender, no rebound pain, no organomegaly, BS present. GU: No hematuria Ext: No pitting leg edema bilaterally. 1+DP/PT pulse bilaterally. Musculoskeletal: No joint deformities, No joint redness or warmth, no limitation of ROM in spin. Skin: No rashes.  Neuro: Alert, oriented X3, cranial nerves II-XII grossly intact, moves all extremities normally.  Psych: Patient is  not psychotic, no suicidal or hemocidal ideation.  Labs on Admission: I have personally reviewed following labs and imaging studies  CBC: Recent Labs  Lab 03/08/21 0415  WBC 7.7  NEUTROABS 4.5  HGB 14.8  HCT 43.8  MCV 90.7  PLT 518   Basic Metabolic Panel: Recent Labs  Lab 03/08/21 0415  NA 139  K 3.9  CL 106  CO2 24  GLUCOSE 139*  BUN 23*  CREATININE 0.87  CALCIUM 9.0  MG 1.9   GFR: Estimated Creatinine Clearance: 119.4 mL/min (by C-G formula based on SCr of 0.87 mg/dL). Liver Function Tests: Recent Labs  Lab 03/08/21 0415  AST 47*  ALT 30  ALKPHOS 49  BILITOT 0.8  PROT 6.9  ALBUMIN 4.0   No results for input(s): LIPASE, AMYLASE in the last 168 hours. No results for input(s): AMMONIA in the last 168 hours. Coagulation Profile: Recent Labs  Lab 03/08/21 0415  INR 1.0   Cardiac Enzymes: No results for input(s): CKTOTAL, CKMB, CKMBINDEX, TROPONINI in the last 168 hours. BNP (last 3 results) No results for input(s): PROBNP in the last 8760 hours. HbA1C: No results for input(s): HGBA1C in the last 72 hours. CBG: Recent Labs  Lab 03/08/21 0844  GLUCAP 135*    Lipid Profile: No results for input(s): CHOL, HDL, LDLCALC, TRIG, CHOLHDL, LDLDIRECT in the last 72 hours. Thyroid Function Tests: No results for input(s): TSH, T4TOTAL, FREET4, T3FREE, THYROIDAB in the last 72 hours. Anemia Panel: No results for input(s): VITAMINB12, FOLATE, FERRITIN, TIBC, IRON, RETICCTPCT in the last 72 hours. Urine analysis: No results found for: COLORURINE, APPEARANCEUR, LABSPEC, PHURINE, GLUCOSEU, HGBUR, BILIRUBINUR, KETONESUR, PROTEINUR, UROBILINOGEN, NITRITE, LEUKOCYTESUR Sepsis Labs: @LABRCNTIP (procalcitonin:4,lacticidven:4) )No results found for this or any previous visit (from the past 240 hour(s)).   Radiological Exams on Admission: No results found.   EKG: I have personally reviewed.  Atrial fibrillation, QTC 462, heart rate 133, low voltage, early R wave progression Assessment/Plan Principal Problem:   Atrial fibrillation with RVR (HCC) Active Problems:   CAD (coronary artery disease)   HTN (hypertension)   Hyperlipidemia   Diabetes mellitus without complication ([place])   New onset atrial fibrillation with RVR: Initially heart rate was up to 180-200s, improved with metoprolol and Cardizem treatment, but currently heart rate is still elevated, 130-140s.  Shortness of breath has resolved.  No chest pain currently.  Troponin negative x2. CHA2DS2-VASc Score is 3, needs oral anticoagulation.  -admit to progressive bed as inpatient -IV heparin is started by EDP -Started Cardizem drip -Check TSH level -Consulted Dr. Clayborn Bigness of cardiology  CAD (coronary artery disease): s/p of stent. No chest pain -on zocor -pt was given 324 mg ASA in ED  HTN (hypertension): -Hold lisinopril since blood pressure is soft.  Patient is on Cardizem drip -IV hydralazine as needed  Hyperlipidemia -Zocor  Diabetes mellitus without complication [facility]): Recent A1c 9.2, poorly controlled.  Patient is taking lactulose, Ozempic, Jardiance, Degludec insulin-liraglutide (50  units daily) -Start Lantus 25 unit daily -Sliding scale insulin    DVT ppx: on IV Heparin   Code Status: Full code Family Communication: not done, no family member is at bed side.   Disposition Plan:  Anticipate discharge back to previous environment Consults called:  Dr. Clayborn Bigness of card Admission status and Level of care: Progressive Cardiac:    as inpt        Status is: Inpatient  Remains inpatient appropriate because:Inpatient level of care appropriate due to severity of illness  Dispo: The patient  is from: Home              Anticipated d/c is to: Home              Patient currently is not medically stable to d/c.   Difficult to place patient No          Date of Service 03/08/2021    [facility]   If 7PM-7AM, please contact night-coverage [email or web address] 03/08/2021, 9:03 AM

## 2021-03-08 NOTE — ED Provider Notes (Signed)
[facility] Emergency Department Provider Note  ____________________________________________   Event Date/Time   First MD Initiated Contact with Patient 03/08/21 0404     (approximate)  I have reviewed the triage vital signs and the nursing notes.   HISTORY  Chief Complaint Chest Pain    HPI Christopher Dennis is a 55 y.o. male with medical history as listed below which notably includes a prior cardiac catheterization with stent placement about 12 years ago.  His cardiologist is Dr. Ubaldo Glassing and he has not seen him in about a year.  He has no history of atrial fibrillation.  The patient presents by EMS after a cute onset severe chest pressure and rapid heart rate associated with breaking out in a "cold sweat".  He has had no nausea nor vomiting.  No sharp pain.  No numbness or tingling or weakness in his extremities.  He denies fever/chills, sore throat, shortness of breath, cough.  He said that he had a normal day yesterday but the episode tonight was acute in onset, severe, and woke him from sleep.  He has not ever had an episode like this in the past.  He has never been told that he has atrial fibrillation.  He is not on anticoagulation.  When EMS arrived, they reported that his heart rate was between 180 and 200 with atrial fibrillation on their monitor.  They gave him about 250 mL of normal saline and metoprolol 5 mg IV which brought his rate down substantially but he remains in A. fib with RVR with a rate somewhere around 120.  However he says that reducing the rate improved his chest pressure substantially and he only has mild discomfort at this time.     Past Medical History:  Diagnosis Date   Abnormal LFTs 12/04/2012   Arteriosclerosis of coronary artery 12/04/2012   Back ache 12/04/2012   BP (high blood pressure) 12/04/2012   Clinical depression 12/04/2012   DDD (degenerative disc disease), lumbosacral 12/11/2012   Diabetes ([place])    Displacement of  lumbar intervertebral disc without myelopathy 12/11/2012   HLD (hyperlipidemia) 12/04/2012   Lumbar canal stenosis 12/11/2012   Neuropathy 12/04/2012   Sleep apnea    CPAP - hasn't been using, no sure if it works   Engineer, maintenance and lumbosacral neuritis 12/11/2012    Patient Active Problem List   Diagnosis Date Noted   Atrial fibrillation with RVR ([place]) 03/08/2021   Diabetes mellitus without complication ([place]) 41/66/0630   New onset atrial fibrillation ([place])    Pneumonia due to COVID-19 virus 10/26/2019   Uncontrolled type 2 diabetes mellitus with hyperglycemia, without long-term current use of insulin ([place]) 03/02/2018   Type 2 diabetes mellitus ([place]) 03/02/2018   Trouble swallowing    Gastritis    Stricture and stenosis of esophagus    Special screening for malignant neoplasms, colon    Benign neoplasm of sigmoid colon    Fourth degree hemorrhoids    DDD (degenerative disc disease), lumbosacral 12/11/2012   Displacement of lumbar intervertebral disc without myelopathy 12/11/2012   Thoracic or lumbosacral neuritis or radiculitis, unspecified 12/11/2012   Spinal stenosis of lumbar region without neurogenic claudication 12/11/2012   Back pain 12/04/2012   CAD (coronary artery disease) 12/04/2012   Clinical depression 12/04/2012   Diabetes ([place]) 12/04/2012   Elevated liver function tests 12/04/2012   HTN (hypertension) 12/04/2012   Hyperlipidemia 12/04/2012   Hyponatremia 12/04/2012   Seizure-like activity ([place]) 12/04/2012   Neuropathy 12/04/2012   Diabetes  mellitus type 2, uncomplicated ([place]) 88/41/6606    Past Surgical History:  Procedure Laterality Date   BACK SURGERY  2000   COLONOSCOPY WITH PROPOFOL N/A 05/12/2015   Procedure: COLONOSCOPY WITH PROPOFOL;  Surgeon: Lucilla Lame, MD;  Location: [place];  Service: Endoscopy;  Laterality: N/A;   CORONARY ANGIOPLASTY WITH STENT PLACEMENT  06/17/2011   ESOPHAGOGASTRODUODENOSCOPY (EGD) WITH PROPOFOL N/A 05/12/2015   Procedure:  ESOPHAGOGASTRODUODENOSCOPY (EGD) WITH PROPOFOL;  Surgeon: Lucilla Lame, MD;  Location: [place];  Service: Endoscopy;  Laterality: N/A;  CPAP   KNEE ARTHROSCOPY Right 09/19/2014   Laminectomy posterior cervicle decomp     laminectomy posterior lumbar facetectomy & Foraminotomy w/decomp  12/15/2012   meniscus tear  2006   partial medial and extensive lateral meniscectomy     POLYPECTOMY  05/12/2015   Procedure: POLYPECTOMY;  Surgeon: Lucilla Lame, MD;  Location: [place];  Service: Endoscopy;;    Prior to Admission medications   Medication Sig Start Date End Date Taking? Authorizing Provider  empagliflozin (JARDIANCE) 25 MG TABS tablet Take 25 mg by mouth daily.    Yes [provider]  Insulin Degludec-Liraglutide (XULTOPHY) 100-3.6 UNIT-MG/ML SOPN Inject 50 Units into the skin at bedtime.    Yes [provider]  lisinopril (PRINIVIL,ZESTRIL) 20 MG tablet Take 20 mg by mouth daily.    Yes [provider]  metFORMIN (GLUCOPHAGE) 1000 MG tablet Take 1,000 mg by mouth 2 (two) times daily.    Yes [provider]  pioglitazone (ACTOS) 30 MG tablet Take 30 mg by mouth daily.    Yes [provider]  simvastatin (ZOCOR) 40 MG tablet Take 40 mg by mouth at bedtime.    Yes [provider]  Vitamin D, Ergocalciferol, (DRISDOL) 1.25 MG (50000 UT) CAPS capsule Take 50,000 Units by mouth once a week.    Yes [provider]  albuterol (VENTOLIN HFA) 108 (90 Base) MCG/ACT inhaler Inhale 2 puffs into the lungs every 6 (six) hours as needed for wheezing or shortness of breath. Patient not taking: No sig reported 10/22/19   Laban Emperor, PA-C  guaiFENesin-dextromethorphan (ROBITUSSIN DM) 100-10 MG/5ML syrup Take 5 mLs by mouth every 4 (four) hours as needed for cough. Patient not taking: No sig reported 10/29/19   Enzo Bi, MD  ONE [facility] ULTRA TEST test strip TEST AT LEAST TID Patient not taking: Reported on 03/08/2021 10/05/16    Versie Starks, PA-C  OZEMPIC, 0.25 OR 0.5 MG/DOSE, 2 MG/1.5ML SOPN Inject 0.5 mg into the skin. 03/05/21   [provider]    Allergies Patient has no known allergies.  Family History  Problem Relation Age of Onset   Diabetes Mother    COPD Mother    Alcohol abuse Father    Asthma Sister     Social History Social History   Tobacco Use   Smoking status: Former   Smokeless tobacco: Current    Types: Snuff  Substance Use Topics   Alcohol use: Yes    Alcohol/week: 0.0 standard drinks    Comment: rarely - 1 beer/month   Drug use: No    Review of Systems Constitutional: No fever/chills Eyes: No visual changes. ENT: No sore throat. Cardiovascular: Positive for chest pressure and palpitations as described above. Respiratory: Denies shortness of breath. Gastrointestinal: No abdominal pain.  No nausea, no vomiting.  No diarrhea.  No constipation. Genitourinary: Negative for dysuria. Musculoskeletal: Negative for neck pain.  Negative for back pain. Integumentary: Negative for rash.  Positive for diaphoresis associated with chest pressure. Neurological: Negative for headaches, focal weakness or numbness.   ____________________________________________   PHYSICAL EXAM:  VITAL SIGNS: ED Triage Vitals  Enc Vitals Group     BP 03/08/21 0405 116/77     Pulse Rate 03/08/21 0401 (!) 126     Resp 03/08/21 0401 20     Temp --      Temp Source 03/08/21 0401 Oral     SpO2 03/08/21 0401 100 %     Weight 03/08/21 0404 108.9 kg (240 lb)     Height 03/08/21 0404 1.829 m (6')     Head Circumference --      Peak Flow --      Pain Score 03/08/21 0404 2     Pain Loc --      Pain Edu? --      Excl. in [place]? --     Constitutional: Alert and oriented.  Eyes: Conjunctivae are normal.  Head: Atraumatic. Nose: No congestion/rhinnorhea. Mouth/Throat: Patient is wearing a mask. Neck: No stridor.  No meningeal signs.   Cardiovascular: Atrial fibrillation with RVR with rates  ranging from about 1 15-1 50. Respiratory: Normal respiratory effort.  No retractions. Gastrointestinal: Soft and nontender. No distention.  Musculoskeletal: No lower extremity tenderness nor edema. No gross deformities of extremities. Neurologic:  Normal speech and language. No gross focal neurologic deficits are appreciated.  Skin:  Skin is warm, dry and intact. Psychiatric: Mood and affect are normal. Speech and behavior are normal.  ____________________________________________   LABS (all labs ordered are listed, but only abnormal results are displayed)  Labs Reviewed  COMPREHENSIVE METABOLIC PANEL - Abnormal; Notable for the following components:      Result Value   Glucose, Bld 139 (*)    BUN 23 (*)    AST 47 (*)    All other components within normal limits  RESP PANEL BY RT-PCR (FLU A&B, COVID) ARPGX2  PROTIME-INR  APTT  CBC WITH DIFFERENTIAL/PLATELET  MAGNESIUM  HEPARIN LEVEL (UNFRACTIONATED)  HEMOGLOBIN A1C  TROPONIN I (HIGH SENSITIVITY)  TROPONIN I (HIGH SENSITIVITY)   ____________________________________________  EKG  ED ECG REPORT I, Hinda Kehr, the attending physician, personally viewed and interpreted this ECG.  Date: 03/08/2021 EKG Time: 4:03 AM Rate: 133 Rhythm: Atrial fibrillation with RVR QRS Axis: normal Intervals: Abnormal due to A. fib with abnormal PR interval ST/T Wave abnormalities: Non-specific ST segment / T-wave changes, but no clear evidence of acute ischemia.  No significant ST depression or elevation. Narrative Interpretation: no definitive evidence of acute ischemia; does not meet STEMI criteria.  ____________________________________________   PROCEDURES   Procedure(s) performed (including Critical Care):  .1-3 Lead EKG Interpretation  Date/Time: 03/08/2021 4:18 AM Performed by: Hinda Kehr, MD Authorized by: Hinda Kehr, MD     Interpretation: abnormal     ECG rate:  140   ECG rate assessment: tachycardic     Rhythm:  atrial fibrillation     Ectopy: none     Conduction: normal   .Critical Care  Date/Time: 03/08/2021 4:18 AM Performed by: Hinda Kehr, MD Authorized by: Hinda Kehr, MD   Critical care provider statement:    Critical care time (minutes):  45   Critical care time was exclusive of:  Separately billable procedures and treating other patients   Critical care was necessary to treat or prevent imminent or life-threatening deterioration of the following conditions:  Circulatory failure   Critical care was time spent personally by me on the following  activities:  Development of treatment plan with patient or surrogate, discussions with consultants, evaluation of patient's response to treatment, examination of patient, obtaining history from patient or surrogate, ordering and performing treatments and interventions, ordering and review of laboratory studies, ordering and review of radiographic studies, pulse oximetry, re-evaluation of patient's condition and review of old charts   ____________________________________________   [place] / MDM / [place] / ED COURSE  As part of my medical decision making, I reviewed the following data within the [place] History obtained from family, Nursing notes reviewed and incorporated, Labs reviewed , EKG interpreted , Old chart reviewed, Discussed with admitting physician , and Notes from prior ED visits   Differential diagnosis includes, but is not limited to, new onset atrial fibrillation with RVR, ACS, acute infection, electrolyte or metabolic abnormality.  The patient is on the cardiac monitor to evaluate for evidence of arrhythmia and/or significant heart rate changes.  The patient has no prior history of atrial fibrillation of which he is aware.  This could be a brand-new episode that woke him up just prior to arrival, but now that his rate has improved slightly, he no longer feels the chest pressure and we  cannot be certain of when exactly his symptoms began.  It is possible he has been in atrial fibrillation for longer and only now is feeling a due to the rapid rate.  He is not unstable and he feels better than he did before although his rate remains elevated and on our monitor it is ranging between about 115 and 140.  I believe he would benefit from improved rate control over the 1 dose of metoprolol and I am giving him diltiazem 15 mg IV.  His blood pressure is lower than I would prefer but I believe the benefits will outweigh the risk.  I am also giving him a 500 mL normal saline bolus.  Patient is not in distress at this time and is laughing and joking with me.  Anticipate he will benefit from admission given the acute onset and severity of the symptoms with prior known cardiac disease and cardiac stent but will reassess after additional evaluation including troponin.     Clinical Course as of 03/08/21 0816  Sun Mar 08, 2021  6294 Heart rate down to 85 after diltiazem and the patient's blood pressure has maintained at 116/70.  I am going to give a dose of diltiazem 120 mg p.o. so he does not have rebound tachycardia once the IV wears off [CF]  0517 Patient feeling better.  Rate is more well controlled at around 100-105 but patient remains in A. fib.  He does not feel any symptoms now which again leads me to wonder whether he has been in atrial fibrillation for longer than he thinks.  Given the severity and acute onset of his symptoms including chest pressure and feeling of impending doom, I think it is very reasonable to admit him to the hospital on a heparin infusion for additional monitoring and cardiac evaluation.  I am consulting the hospitalist.  The patient and his wife agree with the plan. [CF]  [phone] Discussed with Dr. Sidney Ace who will admit. [CF]    Clinical Course User Index [CF] Hinda Kehr, MD     ____________________________________________  FINAL CLINICAL IMPRESSION(S) / ED  DIAGNOSES  Final diagnoses:  New onset atrial fibrillation ([place])  Atrial fibrillation with RVR ([place])  Chest pressure     MEDICATIONS GIVEN DURING THIS VISIT:  Medications  aspirin chewable tablet 324 mg (324 mg Oral Not Given 03/08/21 0435)  heparin bolus via infusion 5,000 Units (5,000 Units Intravenous Bolus from Bag 03/08/21 0601)    Followed by  heparin ADULT infusion 100 units/mL (25000 units/262mL) (1,500 Units/hr Intravenous New Bag/Given 03/08/21 0601)  albuterol (VENTOLIN HFA) 108 (90 Base) MCG/ACT inhaler 2 puff (has no administration in time range)  ondansetron (ZOFRAN) injection 4 mg (has no administration in time range)  acetaminophen (TYLENOL) tablet 650 mg (has no administration in time range)  hydrALAZINE (APRESOLINE) injection 5 mg (has no administration in time range)  insulin aspart (novoLOG) injection 0-9 Units (has no administration in time range)  insulin aspart (novoLOG) injection 0-5 Units (has no administration in time range)  insulin glargine (LANTUS) injection 25 Units (has no administration in time range)  diltiazem (CARDIZEM) injection 15 mg (15 mg Intravenous Given 03/08/21 0415)  sodium chloride 0.9 % bolus 500 mL (0 mLs Intravenous Stopped 03/08/21 0456)  diltiazem (CARDIZEM) tablet 120 mg (120 mg Oral Given 03/08/21 0454)     ED Discharge Orders     None        Note:  This document was prepared using Dragon voice recognition software and may include unintentional dictation errors.   Hinda Kehr, MD 03/08/21 [phone]

## 2021-03-08 NOTE — ED Notes (Signed)
Pt hooked back up and IVs restarted at this time.

## 2021-03-08 NOTE — ED Notes (Signed)
Cardiologist at bedside.  

## 2021-03-08 NOTE — Progress Notes (Signed)
ANTICOAGULATION CONSULT NOTE   Pharmacy Consult for heparin infusion Indication: atrial fibrillation  No Known Allergies  Patient Measurements: Height: 6' (182.9 cm) Weight: 108.9 kg (240 lb) IBW/kg (Calculated) : 77.6 Heparin Dosing Weight: 100.6 kg  Vital Signs: Temp Source: Oral (07/17 0401) BP: 97/67 (07/17 0515) Pulse Rate: 70 (07/17 0515)  Labs: Recent Labs    03/08/21 0415  HGB 14.8  HCT 43.8  PLT 175  APTT 29  LABPROT 13.3  INR 1.0  CREATININE 0.87  TROPONINIHS 7    Estimated Creatinine Clearance: 119.4 mL/min (by C-G formula based on SCr of 0.87 mg/dL).   Medical History: Past Medical History:  Diagnosis Date   Abnormal LFTs 12/04/2012   Arteriosclerosis of coronary artery 12/04/2012   Back ache 12/04/2012   BP (high blood pressure) 12/04/2012   Clinical depression 12/04/2012   DDD (degenerative disc disease), lumbosacral 12/11/2012   Diabetes ([place])    Displacement of lumbar intervertebral disc without myelopathy 12/11/2012   HLD (hyperlipidemia) 12/04/2012   Lumbar canal stenosis 12/11/2012   Neuropathy 12/04/2012   Sleep apnea    CPAP - hasn't been using, no sure if it works   Engineer, maintenance and lumbosacral neuritis 12/11/2012    Assessment: Pt is 55 yo male arriving via EMS c/o chest pain with a fib with RVR.  Goal of Therapy:  Heparin level 0.3-0.7 units/ml Monitor platelets by anticoagulation protocol: Yes   Plan:  Give 5000 units bolus x 1 Start heparin infusion at 1500 units/hr Check anti-Xa level in 6 hours and daily while on heparin Continue to monitor H&H and platelets  Renda Rolls, PharmD, [facility] 03/08/2021 5:33 AM

## 2021-03-08 NOTE — ED Triage Notes (Signed)
Per ems pt with 30 min chest pain on their arrival with a fib with RVR. Ems gave 5mg  iv metoprolol. Md at bedside.

## 2021-03-09 DIAGNOSIS — E119 Type 2 diabetes mellitus without complications: Secondary | ICD-10-CM | POA: Diagnosis not present

## 2021-03-09 DIAGNOSIS — I4891 Unspecified atrial fibrillation: Secondary | ICD-10-CM | POA: Diagnosis not present

## 2021-03-09 DIAGNOSIS — I10 Essential (primary) hypertension: Secondary | ICD-10-CM | POA: Diagnosis not present

## 2021-03-09 LAB — CBC
HCT: 39.5 % (ref 39.0–52.0)
Hemoglobin: 13.2 g/dL (ref 13.0–17.0)
MCH: 30.6 pg (ref 26.0–34.0)
MCHC: 33.4 g/dL (ref 30.0–36.0)
MCV: 91.4 fL (ref 80.0–100.0)
Platelets: 138 10*3/uL — ABNORMAL LOW (ref 150–400)
RBC: 4.32 MIL/uL (ref 4.22–5.81)
RDW: 13.6 % (ref 11.5–15.5)
WBC: 6.7 10*3/uL (ref 4.0–10.5)
nRBC: 0 % (ref 0.0–0.2)

## 2021-03-09 LAB — TSH: TSH: 1.209 u[IU]/mL (ref 0.350–4.500)

## 2021-03-09 LAB — HEPARIN LEVEL (UNFRACTIONATED): Heparin Unfractionated: 0.64 IU/mL (ref 0.30–0.70)

## 2021-03-09 LAB — LIPID PANEL
Cholesterol: 107 mg/dL (ref 0–200)
HDL: 34 mg/dL — ABNORMAL LOW (ref >40)
LDL Cholesterol: 41 mg/dL (ref 0–99)
Total CHOL/HDL Ratio: 3.1 RATIO
Triglycerides: 160 mg/dL — ABNORMAL HIGH (ref <150)
VLDL: 32 mg/dL (ref 0–40)

## 2021-03-09 LAB — HIV ANTIBODY (ROUTINE TESTING W REFLEX): HIV Screen 4th Generation wRfx: NONREACTIVE

## 2021-03-09 LAB — GLUCOSE, CAPILLARY
Glucose-Capillary: 130 mg/dL — ABNORMAL HIGH (ref 70–99)
Glucose-Capillary: 144 mg/dL — ABNORMAL HIGH (ref 70–99)
Glucose-Capillary: 172 mg/dL — ABNORMAL HIGH (ref 70–99)

## 2021-03-09 MED ORDER — ATORVASTATIN CALCIUM 40 MG PO TABS: 40.0000 mg | ORAL_TABLET | Freq: Every day | ORAL | 0 refills | Status: DC

## 2021-03-09 MED ORDER — DILTIAZEM HCL 30 MG PO TABS
60.0000 mg | ORAL_TABLET | Freq: Four times a day (QID) | ORAL | Status: DC
  Administered 2021-03-09: 60 mg via ORAL
  Filled 2021-03-09: qty 2

## 2021-03-09 MED ORDER — APIXABAN 5 MG PO TABS: 5.0000 mg | ORAL_TABLET | Freq: Two times a day (BID) | ORAL | 0 refills | Status: AC

## 2021-03-09 MED ORDER — ATORVASTATIN CALCIUM 20 MG PO TABS
40.0000 mg | ORAL_TABLET | Freq: Every day | ORAL | Status: DC
  Administered 2021-03-09: 40 mg via ORAL
  Filled 2021-03-09: qty 2

## 2021-03-09 MED ORDER — DILTIAZEM HCL ER COATED BEADS 240 MG PO CP24: 240.0000 mg | ORAL_CAPSULE | Freq: Every day | ORAL | 0 refills | Status: DC

## 2021-03-09 MED ORDER — DM-GUAIFENESIN ER 30-600 MG PO TB12: 1.0000 | ORAL_TABLET | Freq: Two times a day (BID) | ORAL | Status: DC | PRN

## 2021-03-09 MED ORDER — SIMVASTATIN 20 MG PO TABS
40.0000 mg | ORAL_TABLET | Freq: Every day | ORAL | Status: DC
  Filled 2021-03-09: qty 2

## 2021-03-09 MED ORDER — APIXABAN 5 MG PO TABS
5.0000 mg | ORAL_TABLET | Freq: Two times a day (BID) | ORAL | Status: DC
  Administered 2021-03-09: 5 mg via ORAL
  Filled 2021-03-09: qty 1

## 2021-03-09 NOTE — Discharge Summary (Signed)
Physician Discharge Summary  Patient ID: Christopher Dennis MRN: [id number] DOB/AGE: [birth year] 55 y.o.  Admit date: 03/08/2021 Discharge date: 03/09/2021  Admission Diagnoses:  Discharge Diagnoses:  Principal Problem:   Atrial fibrillation with RVR ([place]) Active Problems:   CAD (coronary artery disease)   HTN (hypertension)   Hyperlipidemia   Diabetes mellitus without complication ([place]) New onset Atrial fib with RVR  Discharged Condition: good  Hospital Course:  Christopher Dennis is a 55 y.o. male with medical history significant of HTN, HLD, DM, CAD with stent, OSA, depression, chronic back pain, who presents with SOB and palpitation. He was diagnosed with atrial fib with RVR, he is treated with diltiazem drip. He also has been seen by Dr. Clayborn Bigness. He has converted to sinus this am. Spoke with Dr. Dot Lanes, will continue diltiazem and eliquis. Follow wirh Dr. Ubaldo Glassing and PCP as outpatient.    Consults: cardiology  Significant Diagnostic Studies:   Treatments: diltiazem drip  Discharge Exam: Blood pressure 122/83, pulse 70, temperature 98.4 F (36.9 C), temperature source Oral, resp. rate 16, height 6' (1.829 m), weight 108.9 kg, SpO2 99 %. General appearance: alert and cooperative Resp: clear to auscultation bilaterally Cardio: regular rate and rhythm, S1, S2 normal, no murmur, click, rub or gallop GI: soft, non-tender; bowel sounds normal; no masses,  no organomegaly Extremities: extremities normal, atraumatic, no cyanosis or edema  Disposition: Discharge disposition: 01-Home or Self Care       Discharge Instructions     Diet - low sodium heart healthy   Complete by: As directed    Increase activity slowly   Complete by: As directed       Allergies as of 03/09/2021   No Known Allergies      Medication List     STOP taking these medications    albuterol 108 (90 Base) MCG/ACT inhaler Commonly known as: VENTOLIN HFA   guaiFENesin-dextromethorphan  100-10 MG/5ML syrup Commonly known as: ROBITUSSIN DM   lisinopril 20 MG tablet Commonly known as: ZESTRIL   ONE TOUCH ULTRA TEST test strip Generic drug: glucose blood   simvastatin 40 MG tablet Commonly known as: ZOCOR       TAKE these medications    apixaban 5 MG Tabs tablet Commonly known as: ELIQUIS Take 1 tablet (5 mg total) by mouth 2 (two) times daily.   atorvastatin 40 MG tablet Commonly known as: LIPITOR Take 1 tablet (40 mg total) by mouth daily. Start taking on: March 10, 2021   diltiazem 240 MG 24 hr capsule Commonly known as: Cartia XT Take 1 capsule (240 mg total) by mouth daily.   empagliflozin 25 MG Tabs tablet Commonly known as: JARDIANCE Take 25 mg by mouth daily.   metFORMIN 1000 MG tablet Commonly known as: GLUCOPHAGE Take 1,000 mg by mouth 2 (two) times daily.   Ozempic (0.25 or 0.5 MG/DOSE) 2 MG/1.5ML Sopn Generic drug: Semaglutide(0.25 or 0.5MG /DOS) Inject 0.5 mg into the skin.   pioglitazone 30 MG tablet Commonly known as: ACTOS Take 30 mg by mouth daily.   Vitamin D (Ergocalciferol) 1.25 MG (50000 UNIT) Caps capsule Commonly known as: DRISDOL Take 50,000 Units by mouth once a week.   Xultophy 100-3.6 UNIT-MG/ML Sopn Generic drug: Insulin Degludec-Liraglutide Inject 50 Units into the skin at bedtime.        Follow-up Information     Albina Billet, MD Follow up in 1 week(s).   Specialty: Internal Medicine Contact information: [place] [address]  [place], Kenneth A, MD Follow up in 2 week(s).   Specialty: Cardiology Contact information: [place] [id number] [phone]                 Signed: Sharen Hones 03/09/2021, 9:38 AM

## 2021-03-09 NOTE — Progress Notes (Signed)
[facility] Cardiology    SUBJECTIVE: Patient states he feels reasonably well no palpitations tachycardia heart rate is controlled in the 70s has converted to sinus patient feels well enough to go home being switched to p.o. and placed on p.o. anticoagulation states he has a trip to the beach with his family that he would like to go to and then he will follow-up with cardiology   Vitals:   03/09/21 0134 03/09/21 0400 03/09/21 0727 03/09/21 1100  BP: 121/82 105/67 122/83 128/88  Pulse:  69 70   Resp: (!) 24 12 16 16   Temp:   98.4 F (36.9 C) 98.4 F (36.9 C)  TempSrc:   Oral Oral  SpO2:   99% 99%  Weight:      Height:         Intake/Output Summary (Last 24 hours) at 03/09/2021 1804 Last data filed at 03/09/2021 1010 Gross per 24 hour  Intake 794.85 ml  Output --  Net 794.85 ml      PHYSICAL EXAM  General: Well developed, well nourished, in no acute distress HEENT:  Normocephalic and atramatic Neck:  No JVD.  Lungs: Clear bilaterally to auscultation and percussion. Heart: HRRR . Normal S1 and S2 without gallops or murmurs.  Abdomen: Bowel sounds are positive, abdomen soft and non-tender  Msk:  Back normal, normal gait. Normal strength and tone for age. Extremities: No clubbing, cyanosis or edema.   Neuro: Alert and oriented X 3. Psych:  Good affect, responds appropriately   LABS: Basic Metabolic Panel: Recent Labs    03/08/21 0415  NA 139  K 3.9  CL 106  CO2 24  GLUCOSE 139*  BUN 23*  CREATININE 0.87  CALCIUM 9.0  MG 1.9   Liver Function Tests: Recent Labs    03/08/21 0415  AST 47*  ALT 30  ALKPHOS 49  BILITOT 0.8  PROT 6.9  ALBUMIN 4.0   No results for input(s): LIPASE, AMYLASE in the last 72 hours. CBC: Recent Labs    03/08/21 0415 03/09/21 0147  WBC 7.7 6.7  NEUTROABS 4.5  --   HGB 14.8 13.2  HCT 43.8 39.5  MCV 90.7 91.4  PLT 175 138*   Cardiac Enzymes: No results for input(s): CKTOTAL, CKMB, CKMBINDEX, TROPONINI in the last 72  hours. BNP: Invalid input(s): POCBNP D-Dimer: No results for input(s): DDIMER in the last 72 hours. Hemoglobin A1C: Recent Labs    03/08/21 0415  HGBA1C 6.7*   Fasting Lipid Panel: Recent Labs    03/09/21 0147  CHOL 107  HDL 34*  LDLCALC 41  TRIG 160*  CHOLHDL 3.1   Thyroid Function Tests: Recent Labs    03/09/21 0147  TSH 1.209   Anemia Panel: No results for input(s): VITAMINB12, FOLATE, FERRITIN, TIBC, IRON, RETICCTPCT in the last 72 hours.  No results found.   Echo pending to be done as an outpatient  TELEMETRY: Normal sinus rhythm rate of 75 nonspecific ST-T changes:  ASSESSMENT AND PLAN:  Principal Problem:   Atrial fibrillation with RVR ([facility]) Active Problems:   CAD (coronary artery disease)   HTN (hypertension)   Hyperlipidemia   Diabetes mellitus without complication ([facility]) Obesity Possible obstructive sleep apnea  Plan Recommend change from IV to p.o. Cardizem Recommend Eliquis 5 mg twice a day for anticoagulation until seen by cardiology as an outpatient Continue diabetes management control Continue hypertension management and control Cardizem Simvastatin therapy should be switched to Lipitor for lipid management Consider functional study as an outpatient Recommend echocardiogram  as an outpatient Follow-up with  cardiology as an outpatient   Yolonda Kida, MD 03/09/2021 6:04 PM

## 2021-03-09 NOTE — Progress Notes (Signed)
[place] for Apixaban Indication: atrial fibrillation  Patient Measurements: Height: 6' (182.9 cm) Weight: 108.9 kg (240 lb) IBW/kg (Calculated) : 77.6  Labs: Recent Labs    03/08/21 0415 03/08/21 0655 03/08/21 1203 03/08/21 1800 03/09/21 0147  HGB 14.8  --   --   --  13.2  HCT 43.8  --   --   --  39.5  PLT 175  --   --   --  138*  APTT 29  --   --   --   --   LABPROT 13.3  --   --   --   --   INR 1.0  --   --   --   --   HEPARINUNFRC  --   --  0.66 0.74* 0.64  CREATININE 0.87  --   --   --   --   TROPONINIHS 7 8  --   --   --      Estimated Creatinine Clearance: 119.4 mL/min (by C-G formula based on SCr of 0.87 mg/dL).   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 Patient initially started on a heparin drip. Pharmacy now consulted to start apixaban.   Plan:  --Stop IV heparin --Start apixaban 5 mg BID --CBC at least every 3 days per protocol  Benita Gutter  03/09/2021 8:32 AM

## 2021-03-09 NOTE — Progress Notes (Signed)
ANTICOAGULATION CONSULT NOTE   Pharmacy Consult for heparin infusion Indication: atrial fibrillation  No Known Allergies  Patient Measurements: Height: 6' (182.9 cm) Weight: 108.9 kg (240 lb) IBW/kg (Calculated) : 77.6 Heparin Dosing Weight: 100.6 kg  Vital Signs: Temp: 98.5 F (36.9 C) (07/18 0039) Temp Source: Oral (07/18 0039) BP: 121/82 (07/18 0134) Pulse Rate: 71 (07/18 0039)  Labs: Recent Labs    03/08/21 0415 03/08/21 0655 03/08/21 1203 03/08/21 1800 03/09/21 0147  HGB 14.8  --   --   --  13.2  HCT 43.8  --   --   --  39.5  PLT 175  --   --   --  138*  APTT 29  --   --   --   --   LABPROT 13.3  --   --   --   --   INR 1.0  --   --   --   --   HEPARINUNFRC  --   --  0.66 0.74* 0.64  CREATININE 0.87  --   --   --   --   TROPONINIHS 7 8  --   --   --      Estimated Creatinine Clearance: 119.4 mL/min (by C-G formula based on SCr of 0.87 mg/dL).   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  7/17 1203  HL=0.66  Cont rate of 1500 units/hr 7/17 1800  HL =0.74   1500 > 1400 units/hr 7/18 0147 HL =0.64, therapeutic    Goal of Therapy:  Heparin level 0.3-0.7 units/ml Monitor platelets by anticoagulation protocol: Yes   Plan:  Continue heparin infusion at 1400 units/hr. Recheck heparin level in 6 hrs to confirm, then daily F/u CBC with am  labs  Renda Rolls, PharmD, [facility] 03/09/2021 3:05 AM

## 2021-03-09 NOTE — Plan of Care (Signed)

## 2021-04-11 ENCOUNTER — Emergency Department
Admission: EM | Admit: 2021-04-11 | Discharge: 2021-04-11 | Disposition: A | Discharge status: Home / Self Care | Payer: Managed Care, Other (non HMO) | Attending: Emergency Medicine | Admitting: Emergency Medicine

## 2021-04-11 ENCOUNTER — Other Ambulatory Visit: Payer: Self-pay

## 2021-04-11 ENCOUNTER — Emergency Department: Payer: Managed Care, Other (non HMO)

## 2021-04-11 DIAGNOSIS — Z7984 Long term (current) use of oral hypoglycemic drugs: Secondary | ICD-10-CM | POA: Diagnosis not present

## 2021-04-11 DIAGNOSIS — Z20822 Contact with and (suspected) exposure to covid-19: Secondary | ICD-10-CM | POA: Diagnosis not present

## 2021-04-11 DIAGNOSIS — I2510 Atherosclerotic heart disease of native coronary artery without angina pectoris: Secondary | ICD-10-CM | POA: Insufficient documentation

## 2021-04-11 DIAGNOSIS — Z87891 Personal history of nicotine dependence: Secondary | ICD-10-CM | POA: Diagnosis not present

## 2021-04-11 DIAGNOSIS — Z8616 Personal history of COVID-19: Secondary | ICD-10-CM | POA: Insufficient documentation

## 2021-04-11 DIAGNOSIS — Z794 Long term (current) use of insulin: Secondary | ICD-10-CM | POA: Insufficient documentation

## 2021-04-11 DIAGNOSIS — E119 Type 2 diabetes mellitus without complications: Secondary | ICD-10-CM | POA: Diagnosis not present

## 2021-04-11 DIAGNOSIS — Z7901 Long term (current) use of anticoagulants: Secondary | ICD-10-CM | POA: Diagnosis not present

## 2021-04-11 DIAGNOSIS — I10 Essential (primary) hypertension: Secondary | ICD-10-CM | POA: Insufficient documentation

## 2021-04-11 DIAGNOSIS — B349 Viral infection, unspecified: Secondary | ICD-10-CM

## 2021-04-11 DIAGNOSIS — J00 Acute nasopharyngitis [common cold]: Secondary | ICD-10-CM | POA: Diagnosis present

## 2021-04-11 LAB — PROTIME-INR
INR: 1.2 (ref 0.8–1.2)
Prothrombin Time: 15 seconds (ref 11.4–15.2)

## 2021-04-11 LAB — RESP PANEL BY RT-PCR (FLU A&B, COVID) ARPGX2
Influenza A by PCR: NEGATIVE
Influenza B by PCR: NEGATIVE
SARS Coronavirus 2 by RT PCR: NEGATIVE

## 2021-04-11 LAB — BASIC METABOLIC PANEL
Anion gap: 11 (ref 5–15)
BUN: 25 mg/dL — ABNORMAL HIGH (ref 6–20)
CO2: 20 mmol/L — ABNORMAL LOW (ref 22–32)
Calcium: 8.9 mg/dL (ref 8.9–10.3)
Chloride: 105 mmol/L (ref 98–111)
Creatinine, Ser: 0.91 mg/dL (ref 0.61–1.24)
GFR, Estimated: >60 mL/min (ref >60)
Glucose, Bld: 133 mg/dL — ABNORMAL HIGH (ref 70–99)
Potassium: 4 mmol/L (ref 3.5–5.1)
Sodium: 136 mmol/L (ref 135–145)

## 2021-04-11 LAB — CBC
HCT: 43.8 % (ref 39.0–52.0)
Hemoglobin: 14.6 g/dL (ref 13.0–17.0)
MCH: 30.5 pg (ref 26.0–34.0)
MCHC: 33.3 g/dL (ref 30.0–36.0)
MCV: 91.4 fL (ref 80.0–100.0)
Platelets: 169 10*3/uL (ref 150–400)
RBC: 4.79 MIL/uL (ref 4.22–5.81)
RDW: 14.2 % (ref 11.5–15.5)
WBC: 8.9 10*3/uL (ref 4.0–10.5)
nRBC: 0 % (ref 0.0–0.2)

## 2021-04-11 LAB — TROPONIN I (HIGH SENSITIVITY)
Troponin I (High Sensitivity): 8 ng/L (ref <18)
Troponin I (High Sensitivity): 6 ng/L (ref <18)

## 2021-04-11 MED ORDER — ACETAMINOPHEN 500 MG PO TABS
1000.0000 mg | ORAL_TABLET | Freq: Once | ORAL | Status: AC
  Administered 2021-04-11: 1000 mg via ORAL
  Filled 2021-04-11: qty 2

## 2021-04-11 NOTE — ED Provider Notes (Signed)
[facility] Emergency Department Provider Note  ____________________________________________   Event Date/Time   First MD Initiated Contact with Patient 04/11/21 [phone]     (approximate)  I have reviewed the triage vital signs and the nursing notes.   HISTORY  Chief Complaint Chest Pain    HPI Christopher Dennis is a 55 y.o. male with history of recent diagnosis of atrial fibrillation, hyperlipidemia, diabetes, hypertension who presents to the emergency department with complaints of not feeling well.  States symptoms started yesterday at 2 PM and he felt "cold" and "achy".  States he measured his heart rate and it was as fast as 111.  States he had "hard breathing" and "a little fluttering".  No chest pain or chest discomfort.  No cough, nausea, vomiting, diarrhea, dysuria, hematuria, abdominal pain.  No known sick contacts.  States he took a COVID test at home that was negative.  No recent travel or tick bites.  States he took ibuprofen at 11 PM prior to arrival.  [place] he was concerned this could be from his A. fib.      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    Patient Active Problem List   Diagnosis Date Noted   Atrial fibrillation with RVR ([place]) 03/08/2021   Diabetes mellitus without complication ([place]) [id number]   New onset atrial fibrillation ([place])    Pneumonia due to COVID-19 virus 10/26/2019   Uncontrolled type 2 diabetes mellitus  with hyperglycemia, without long-term current use of insulin ([place]) 03/02/2018   Type 2 diabetes mellitus ([place]) 03/02/2018   Trouble swallowing    Gastritis    Stricture and stenosis of esophagus    Special screening for malignant neoplasms, colon    Benign neoplasm of sigmoid colon    Fourth degree hemorrhoids    DDD (degenerative disc disease), lumbosacral 12/11/2012   Displacement of lumbar intervertebral disc without myelopathy 12/11/2012   Thoracic or lumbosacral neuritis or radiculitis, unspecified 12/11/2012   Spinal stenosis of lumbar region without neurogenic claudication 12/11/2012   Back pain 12/04/2012   CAD (coronary artery disease) 12/04/2012   Clinical depression 12/04/2012   Diabetes ([place]) 12/04/2012   Elevated liver function tests 12/04/2012   HTN (hypertension) 12/04/2012   Hyperlipidemia 12/04/2012   Hyponatremia 12/04/2012   Seizure-like activity ([place]) 12/04/2012   Neuropathy 12/04/2012   Diabetes mellitus type 2, uncomplicated ([place]) [id number]    Past Surgical History:  Procedure Laterality Date   BACK SURGERY  2000   COLONOSCOPY WITH PROPOFOL N/A 05/12/2015   Procedure: COLONOSCOPY WITH PROPOFOL;  Surgeon: Lucilla Lame, MD;  Location: [place];  Service: Endoscopy;  Laterality: N/A;   CORONARY ANGIOPLASTY WITH STENT PLACEMENT  06/17/2011   ESOPHAGOGASTRODUODENOSCOPY (EGD) WITH PROPOFOL N/A 05/12/2015   Procedure: ESOPHAGOGASTRODUODENOSCOPY (EGD) WITH PROPOFOL;  Surgeon: Lucilla Lame, MD;  Location: [place];  Service: Endoscopy;  Laterality: N/A;  CPAP   KNEE ARTHROSCOPY Right 09/19/2014   Laminectomy posterior cervicle decomp  laminectomy posterior lumbar facetectomy & Foraminotomy w/decomp  12/15/2012   meniscus tear  2006   partial medial and extensive lateral meniscectomy     POLYPECTOMY  05/12/2015   Procedure: POLYPECTOMY;  Surgeon: Lucilla Lame, MD;  Location: [place];  Service: Endoscopy;;    Prior to Admission  medications   Medication Sig Start Date End Date Taking? Authorizing Provider  apixaban (ELIQUIS) 5 MG TABS tablet Take 1 tablet (5 mg total) by mouth 2 (two) times daily. 03/09/21   Sharen Hones, MD  atorvastatin (LIPITOR) 40 MG tablet Take 1 tablet (40 mg total) by mouth daily. 03/10/21   Sharen Hones, MD  diltiazem (CARTIA XT) 240 MG 24 hr capsule Take 1 capsule (240 mg total) by mouth daily. 03/09/21 03/09/22  Sharen Hones, MD  empagliflozin (JARDIANCE) 25 MG TABS tablet Take 25 mg by mouth daily.     [provider]  Insulin Degludec-Liraglutide (XULTOPHY) 100-3.6 UNIT-MG/ML SOPN Inject 50 Units into the skin at bedtime.     [provider]  metFORMIN (GLUCOPHAGE) 1000 MG tablet Take 1,000 mg by mouth 2 (two) times daily.     [provider]  OZEMPIC, 0.25 OR 0.5 MG/DOSE, 2 MG/1.5ML SOPN Inject 0.5 mg into the skin. 03/05/21   [provider]  pioglitazone (ACTOS) 30 MG tablet Take 30 mg by mouth daily.     [provider]  Vitamin D, Ergocalciferol, (DRISDOL) 1.25 MG (50000 UT) CAPS capsule Take 50,000 Units by mouth once a week.     [provider]    Allergies Patient has no known allergies.  Family History  Problem Relation Age of Onset   Diabetes Mother    COPD Mother    Alcohol abuse Father    Asthma Sister     Social History Social History   Tobacco Use   Smoking status: Former   Smokeless tobacco: Current    Types: Snuff  Substance Use Topics   Alcohol use: Yes    Alcohol/week: 0.0 standard drinks    Comment: rarely - 1 beer/month   Drug use: No    Review of Systems Constitutional: No fever. Eyes: No visual changes. ENT: No sore throat. Cardiovascular: Denies chest pain. Respiratory: Denies shortness of breath. Gastrointestinal: No nausea, vomiting, diarrhea. Genitourinary: Negative for dysuria. Musculoskeletal: Negative for back pain. Skin: Negative for rash. Neurological: Negative for focal weakness or  numbness.  ____________________________________________   PHYSICAL EXAM:  VITAL SIGNS: ED Triage Vitals  Enc Vitals Group     BP 04/11/21 0212 133/83     Pulse Rate 04/11/21 0212 97     Resp 04/11/21 0212 16     Temp 04/11/21 0212 98.5 F (36.9 C)     Temp Source 04/11/21 0212 Oral     SpO2 04/11/21 0212 99 %     Weight 04/11/21 0213 240 lb 4.8 oz (109 kg)     Height 04/11/21 0213 6' (1.829 m)     Head Circumference --      Peak Flow --      Pain Score 04/11/21 0213 5     Pain Loc --      Pain Edu? --      Excl. in [place]? --    CONSTITUTIONAL: Alert and oriented and responds appropriately to questions. Well-appearing; well-nourished HEAD: Normocephalic EYES: Conjunctivae clear, pupils appear equal, EOM appear intact ENT: normal nose; moist mucous membranes NECK: Supple, normal ROM CARD: RRR; S1 and S2 appreciated; no murmurs, no clicks, no  rubs, no gallops RESP: Normal chest excursion without splinting or tachypnea; breath sounds clear and equal bilaterally; no wheezes, no rhonchi, no rales, no hypoxia or respiratory distress, speaking full sentences ABD/GI: Normal bowel sounds; non-distended; soft, non-tender, no rebound, no guarding, no peritoneal signs, no hepatosplenomegaly BACK: The back appears normal EXT: Normal ROM in all joints; no deformity noted, no edema; no cyanosis SKIN: Normal color for age and race; warm; no rash on exposed skin NEURO: Moves all extremities equally PSYCH: The patient's mood and manner are appropriate.  ____________________________________________   LABS (all labs ordered are listed, but only abnormal results are displayed)  Labs Reviewed  BASIC METABOLIC PANEL - Abnormal; Notable for the following components:      Result Value   CO2 20 (*)    Glucose, Bld 133 (*)    BUN 25 (*)    All other components within normal limits  RESP PANEL BY RT-PCR (FLU A&B, COVID) ARPGX2  PROTIME-INR  CBC  TROPONIN I (HIGH SENSITIVITY)  TROPONIN I (HIGH  SENSITIVITY)   ____________________________________________  EKG   EKG Interpretation  Date/Time:  Saturday April 11 2021 01:59:41 EDT Ventricular Rate:  100 PR Interval:  134 QRS Duration: 78 QT Interval:  340 QTC Calculation: 438 R Axis:   12 Text Interpretation: Normal sinus rhythm Cannot rule out Anterior infarct , age undetermined Abnormal ECG Confirmed by Pryor Curia [id number]) on 04/11/2021 4:00:37 AM        ____________________________________________  RADIOLOGY Jessie Foot Ajai [place], personally viewed and evaluated these images (plain radiographs) as part of my medical decision making, as well as reviewing the written report by the radiologist.  ED MD interpretation: Chest x-ray clear.  Official radiology report(s): DG Chest 2 View  Result Date: 04/11/2021 CLINICAL DATA:  Chest pain and shortness of breath EXAM: CHEST - 2 VIEW COMPARISON:  None. FINDINGS: The heart size and mediastinal contours are within normal limits. Both lungs are clear. The visualized skeletal structures are unremarkable. IMPRESSION: No active cardiopulmonary disease. Electronically Signed   By: Ulyses Jarred M.D.   On: 04/11/2021 03:12    ____________________________________________   PROCEDURES  Procedure(s) performed (including Critical Care):  Procedures   ____________________________________________   INITIAL IMPRESSION / ASSESSMENT AND PLAN / ED COURSE  As part of my medical decision making, I reviewed the following data within the [place] notes reviewed and incorporated, Labs reviewed , EKG interpreted , Old EKG reviewed, Radiograph reviewed , and Notes from prior ED visits         Patient here with likely viral syndrome.  Labs unremarkable.  EKG shows no ischemia or arrhythmia.  He is currently in a sinus rhythm.  Hemodynamically stable, well-appearing.  No meningismus or signs or symptoms of meningitis.  Chest x-ray shows no pneumonia.  Abdomen soft  nontender.  No urinary symptoms.  No sick contacts, travel or tick bites.  Repeat troponin pending.  Will give Tylenol for residual symptoms.  He agrees to repeat COVID and flu testing here.  ED PROGRESS  Repeat troponin negative.  COVID and flu negative.  Patient reports feeling better.  I suspect that he has a viral illness causing his symptoms.  Discussed supportive care instructions and return precautions.  Advised him to test himself again for COVID-19 in 24 to 48 hours if he continues to have symptoms.  If he has positive we have discussed the importance of quarantine.  Discussed return precautions.  He is comfortable with this plan.  At this time, I do not feel there is any life-threatening condition present. I have reviewed, interpreted and discussed all results (EKG, imaging, lab, urine as appropriate) and exam findings with patient/family. I have reviewed nursing notes and appropriate previous records.  I feel the patient is safe to be discharged home without further emergent workup and can continue workup as an outpatient as needed. Discussed usual and customary return precautions. Patient/family verbalize understanding and are comfortable with this plan.  Outpatient follow-up has been provided as needed. All questions have been answered.  ____________________________________________   FINAL CLINICAL IMPRESSION(S) / ED DIAGNOSES  Final diagnoses:  Viral syndrome     ED Discharge Orders     None       *Please note:  Christopher Dennis was evaluated in Emergency Department on 04/11/2021 for the symptoms described in the history of present illness. He was evaluated in the context of the global COVID-19 pandemic, which necessitated consideration that the patient might be at risk for infection with the SARS-CoV-2 virus that causes COVID-19. Institutional protocols and algorithms that pertain to the evaluation of patients at risk for COVID-19 are in a state of rapid change based on  information released by regulatory bodies including the CDC and federal and state organizations. These policies and algorithms were followed during the patient's care in the ED.  Some ED evaluations and interventions may be delayed as a result of limited staffing during and the pandemic.*   Note:  This document was prepared using Dragon voice recognition software and may include unintentional dictation errors.    Sara Selvidge, Delice Bison, DO 04/11/21 [phone]

## 2021-04-11 NOTE — ED Triage Notes (Signed)
Patient reports cold chills, body aches, generalized fatigue, SOB, and chest pain x 1 day. Patient reports hx of afib, reports he is on Eliquis. Patient reports '@home'$  Covid test was - PTA.

## 2021-04-11 NOTE — Discharge Instructions (Addendum)
You may alternate Tylenol 1000 mg every 6 hours as needed for pain, fever and Ibuprofen 800 mg every 8 hours as needed for pain, fever.  Please take Ibuprofen with food.  Do not take more than 4000 mg of Tylenol (acetaminophen) in a 24 hour period.  Your lab work was reassuring today.  Your chest x-ray showed no sign of infection.  You are currently in a normal sinus rhythm and not in atrial fibrillation.  I suspect that you have a viral illness causing you to have chills, body aches.  I recommend that you take another COVID test at home in the next 24 to 48 hours if you continue to be symptomatic.  You do not need any antibiotics at this time.  Please return the emergency department if you begin having chest pain, shortness of breath, vomiting that does not stop, abdominal pain, pain with urination or any other symptom concerning to you.

## 2021-04-12 DIAGNOSIS — K529 Noninfective gastroenteritis and colitis, unspecified: Secondary | ICD-10-CM | POA: Insufficient documentation

## 2021-04-12 DIAGNOSIS — I4581 Long QT syndrome: Secondary | ICD-10-CM | POA: Insufficient documentation

## 2021-04-12 DIAGNOSIS — E860 Dehydration: Secondary | ICD-10-CM | POA: Insufficient documentation

## 2021-04-23 DIAGNOSIS — K643 Fourth degree hemorrhoids: Secondary | ICD-10-CM | POA: Insufficient documentation

## 2021-04-23 DIAGNOSIS — M1711 Unilateral primary osteoarthritis, right knee: Secondary | ICD-10-CM | POA: Insufficient documentation

## 2021-04-23 DIAGNOSIS — K2970 Gastritis, unspecified, without bleeding: Secondary | ICD-10-CM | POA: Insufficient documentation

## 2021-04-23 DIAGNOSIS — D125 Benign neoplasm of sigmoid colon: Secondary | ICD-10-CM | POA: Insufficient documentation

## 2021-05-25 DIAGNOSIS — M25561 Pain in right knee: Secondary | ICD-10-CM | POA: Insufficient documentation

## 2021-09-22 IMAGING — CR DG CHEST 2V
3 series · 3 of 3 positions shown · non-contrast
Comparison: None.

CLINICAL DATA: Chest pain and shortness of breath

EXAM:
CHEST - 2 VIEW

[chest pa (1 of 2)]
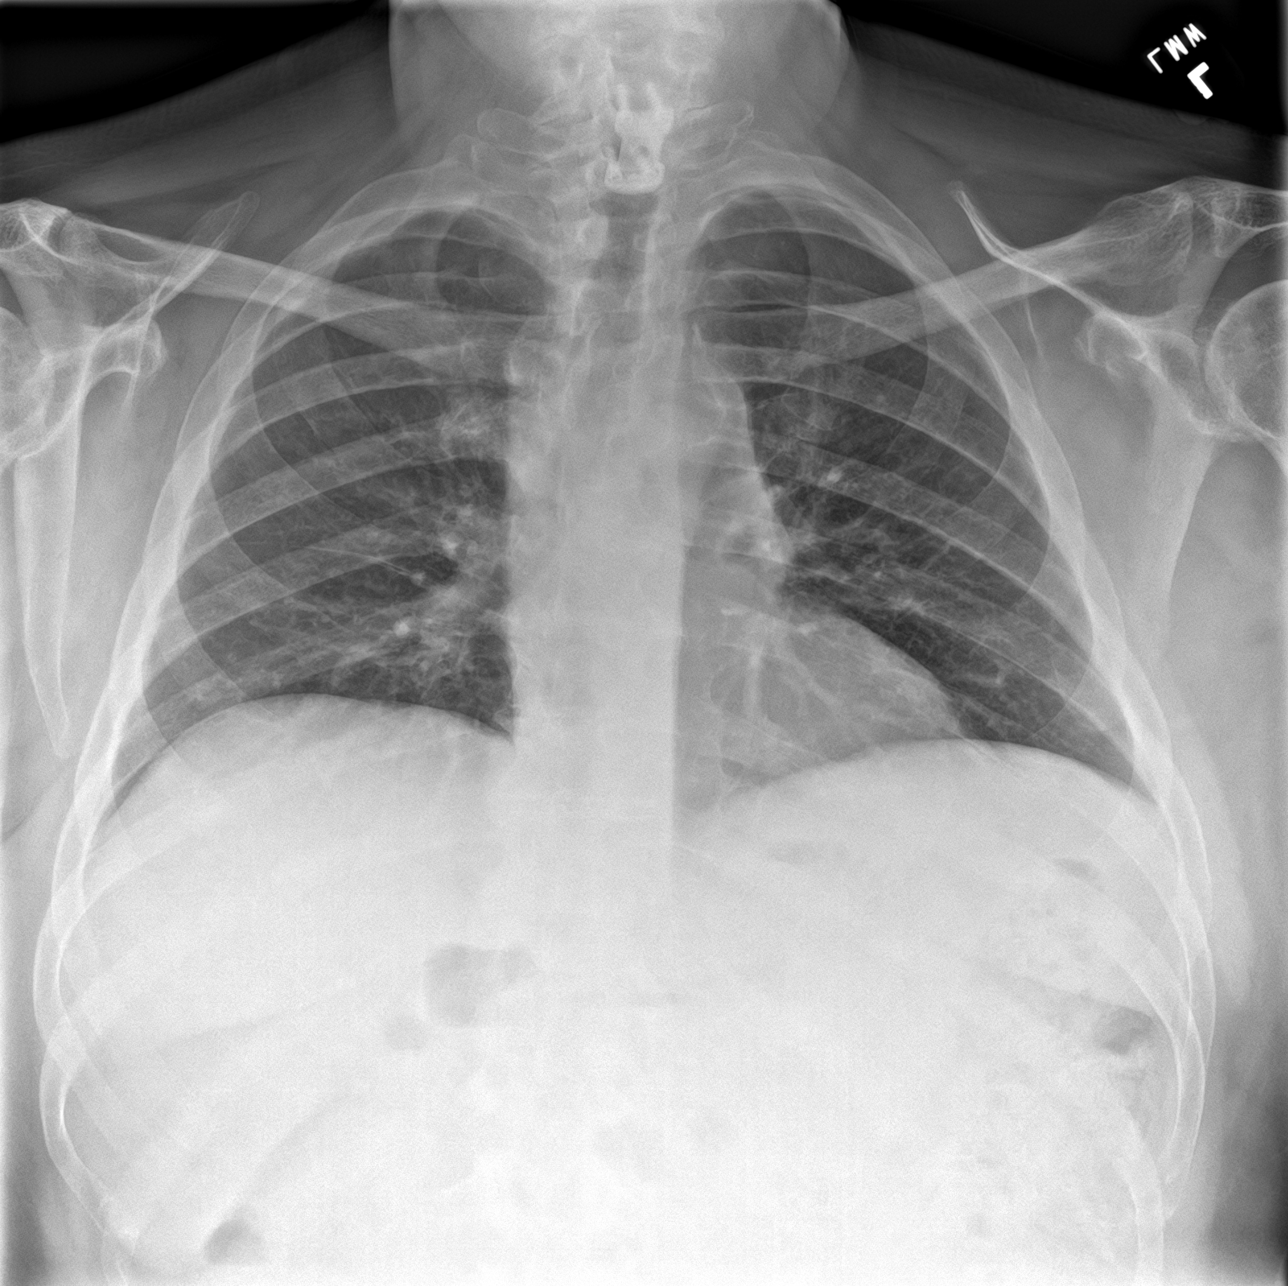

[chest lat]
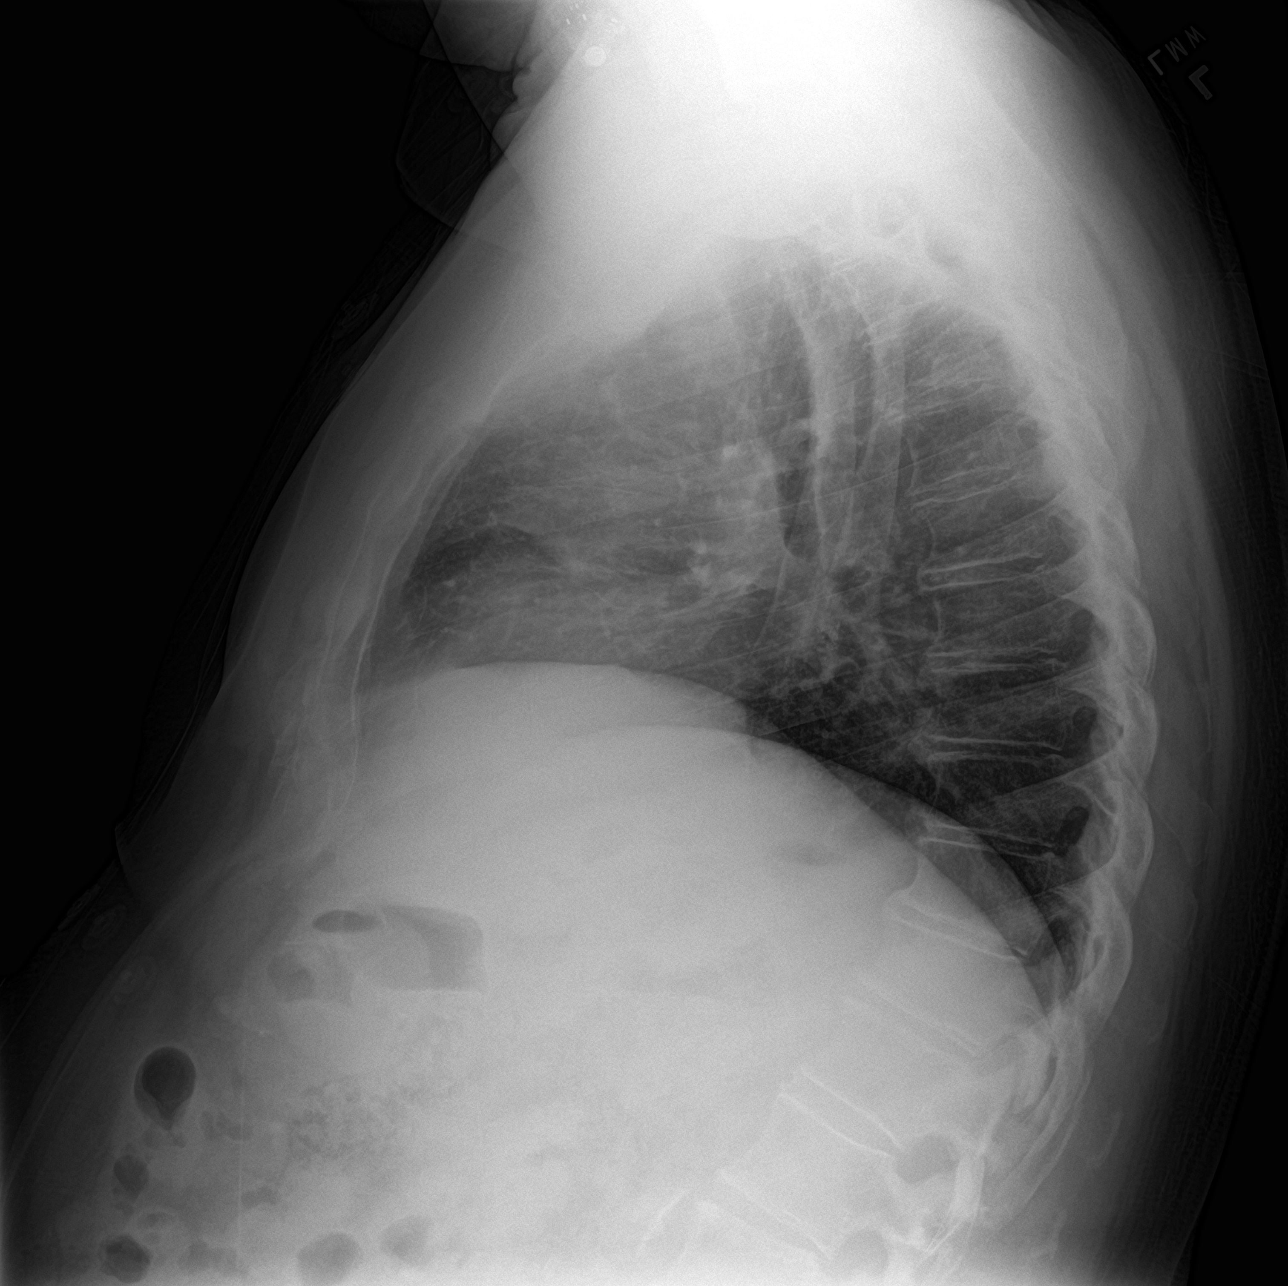

[chest pa (2 of 2)]
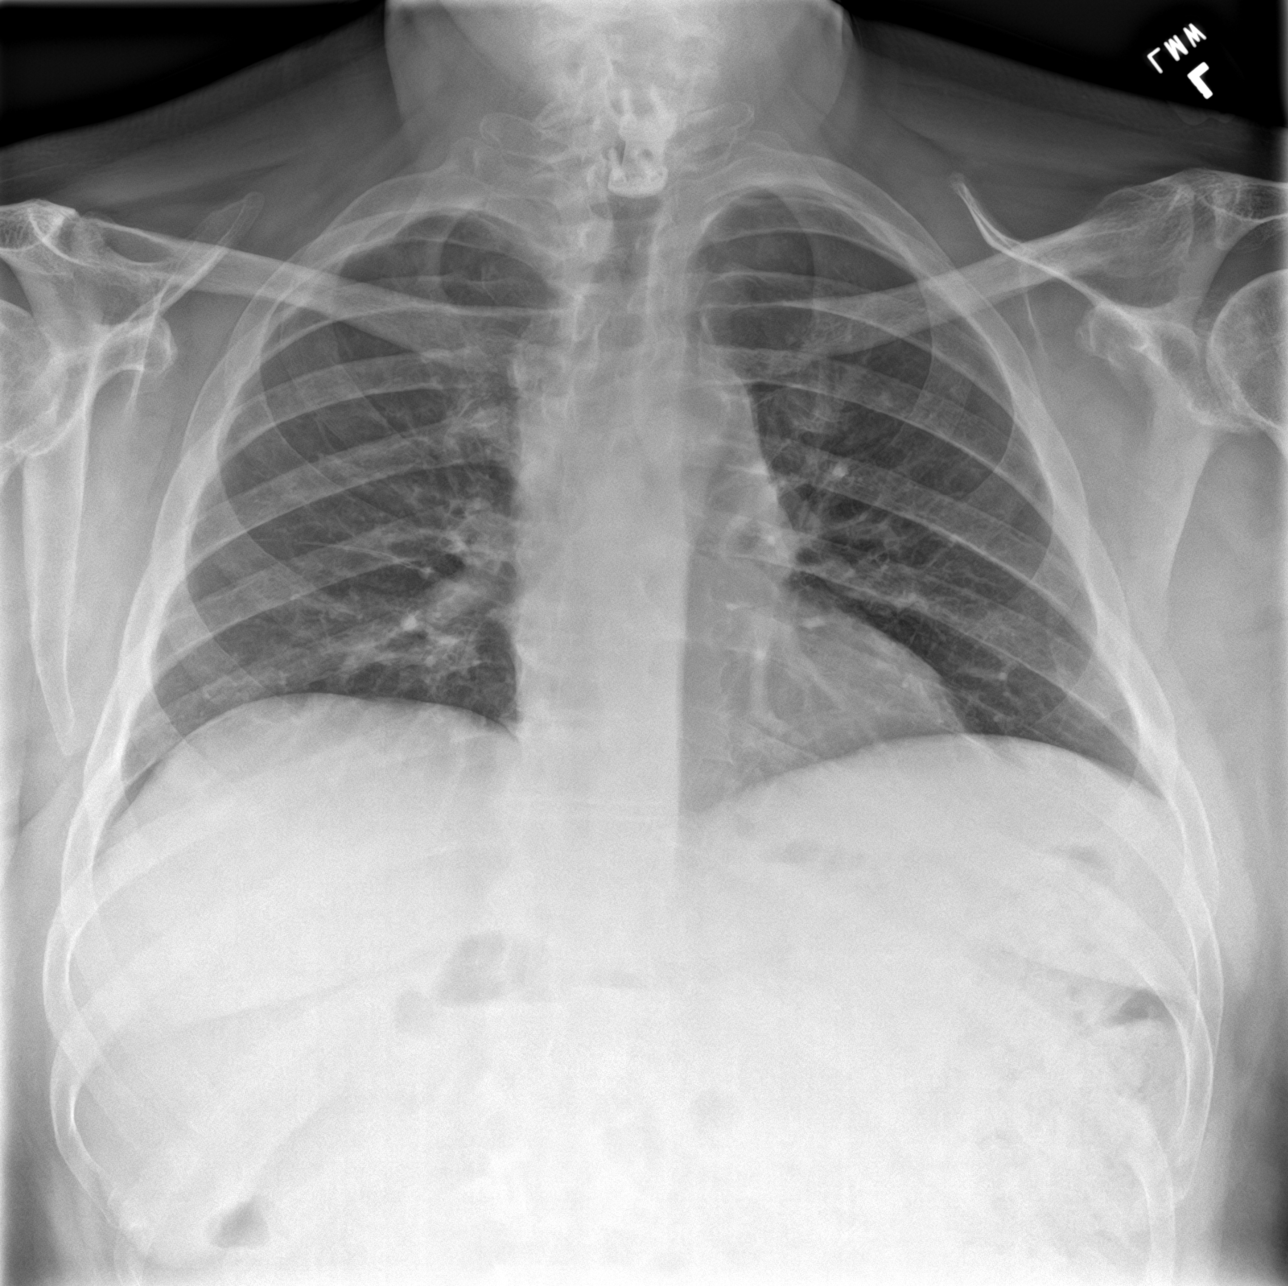

[3 of 3 positions shown; findings below may reference images not displayed]

FINDINGS: The heart size and mediastinal contours are within normal limits.
Both lungs are clear. The visualized skeletal structures are
unremarkable.
IMPRESSION: No active cardiopulmonary disease.

## 2022-04-29 DIAGNOSIS — M47816 Spondylosis without myelopathy or radiculopathy, lumbar region: Secondary | ICD-10-CM | POA: Insufficient documentation

## 2022-04-29 DIAGNOSIS — S39012A Strain of muscle, fascia and tendon of lower back, initial encounter: Secondary | ICD-10-CM | POA: Insufficient documentation

## 2022-04-29 DIAGNOSIS — M533 Sacrococcygeal disorders, not elsewhere classified: Secondary | ICD-10-CM | POA: Insufficient documentation

## 2022-12-19 ENCOUNTER — Other Ambulatory Visit: Payer: Self-pay

## 2022-12-19 ENCOUNTER — Emergency Department: Payer: Managed Care, Other (non HMO)

## 2022-12-19 ENCOUNTER — Emergency Department
Admission: EM | Admit: 2022-12-19 | Discharge: 2022-12-19 | Disposition: A | Discharge status: Home / Self Care | Payer: Managed Care, Other (non HMO) | Attending: Emergency Medicine | Admitting: Emergency Medicine

## 2022-12-19 DIAGNOSIS — U071 COVID-19: Secondary | ICD-10-CM | POA: Diagnosis not present

## 2022-12-19 DIAGNOSIS — E119 Type 2 diabetes mellitus without complications: Secondary | ICD-10-CM | POA: Diagnosis not present

## 2022-12-19 DIAGNOSIS — I10 Essential (primary) hypertension: Secondary | ICD-10-CM | POA: Insufficient documentation

## 2022-12-19 DIAGNOSIS — R059 Cough, unspecified: Secondary | ICD-10-CM | POA: Diagnosis present

## 2022-12-19 LAB — CBC
HCT: 43.5 % (ref 39.0–52.0)
Hemoglobin: 14.4 g/dL (ref 13.0–17.0)
MCH: 30.4 pg (ref 26.0–34.0)
MCHC: 33.1 g/dL (ref 30.0–36.0)
MCV: 92 fL (ref 80.0–100.0)
Platelets: 132 10*3/uL — ABNORMAL LOW (ref 150–400)
RBC: 4.73 MIL/uL (ref 4.22–5.81)
RDW: 12.8 % (ref 11.5–15.5)
WBC: 7.3 10*3/uL (ref 4.0–10.5)
nRBC: 0 % (ref 0.0–0.2)

## 2022-12-19 LAB — COMPREHENSIVE METABOLIC PANEL
ALT: 24 U/L (ref 0–44)
AST: 38 U/L (ref 15–41)
Albumin: 3.9 g/dL (ref 3.5–5.0)
Alkaline Phosphatase: 57 U/L (ref 38–126)
Anion gap: 10 (ref 5–15)
BUN: 28 mg/dL — ABNORMAL HIGH (ref 6–20)
CO2: 20 mmol/L — ABNORMAL LOW (ref 22–32)
Calcium: 9.1 mg/dL (ref 8.9–10.3)
Chloride: 105 mmol/L (ref 98–111)
Creatinine, Ser: 0.92 mg/dL (ref 0.61–1.24)
GFR, Estimated: >60 mL/min (ref >60)
Glucose, Bld: 106 mg/dL — ABNORMAL HIGH (ref 70–99)
Potassium: 3.9 mmol/L (ref 3.5–5.1)
Sodium: 135 mmol/L (ref 135–145)
Total Bilirubin: 1.5 mg/dL — ABNORMAL HIGH (ref 0.3–1.2)
Total Protein: 7.2 g/dL (ref 6.5–8.1)

## 2022-12-19 LAB — SARS CORONAVIRUS 2 BY RT PCR: SARS Coronavirus 2 by RT PCR: POSITIVE — AB

## 2022-12-19 LAB — GROUP A STREP BY PCR: Group A Strep by PCR: NOT DETECTED

## 2022-12-19 MED ORDER — KETOROLAC TROMETHAMINE 30 MG/ML IJ SOLN
30.0000 mg | Freq: Once | INTRAMUSCULAR | Status: AC
  Administered 2022-12-19: 30 mg via INTRAVENOUS
  Filled 2022-12-19: qty 1

## 2022-12-19 MED ORDER — ONDANSETRON HCL 4 MG/2ML IJ SOLN
4.0000 mg | Freq: Once | INTRAMUSCULAR | Status: AC
  Administered 2022-12-19: 4 mg via INTRAVENOUS
  Filled 2022-12-19: qty 2

## 2022-12-19 MED ORDER — SODIUM CHLORIDE 0.9 % IV BOLUS
1000.0000 mL | Freq: Once | INTRAVENOUS | Status: AC
  Administered 2022-12-19: 1000 mL via INTRAVENOUS

## 2022-12-19 MED ORDER — NIRMATRELVIR/RITONAVIR (PAXLOVID)TABLET: 3.0000 | ORAL_TABLET | Freq: Two times a day (BID) | ORAL | 0 refills | Status: AC

## 2022-12-19 NOTE — ED Triage Notes (Signed)
Pt with sore throat, fever, productive cough and chills for 2 days. Denies obvious exposures.

## 2022-12-19 NOTE — ED Provider Notes (Signed)
[facility] Provider Note    Event Date/Time   First MD Initiated Contact with Patient 12/19/22 1349     (approximate)  History   Chief Complaint: Cough, Sore Throat, and Chills  HPI  Christopher Dennis is a 57 y.o. male with a past medical history of hypertension, hyperlipidemia, diabetes, presents emergency department for cough congestion subjective fever and chills.  According to the patient 2 days ago he developed slight congestion yesterday he developed subjective fever chills generalized weakness with worsening cough and sputum production.  Patient states today symptoms seem to worsen so he came to the emergency department for evaluation.  Physical Exam   Triage Vital Signs: ED Triage Vitals  Enc Vitals Group     BP 12/19/22 1338 113/84     Pulse Rate 12/19/22 1338 98     Resp 12/19/22 1338 20     Temp 12/19/22 1338 98 F (36.7 C)     Temp Source 12/19/22 1338 Oral     SpO2 12/19/22 1338 96 %     Weight 12/19/22 1334 240 lb 4.8 oz (109 kg)     Height 12/19/22 1334 5\' 6"  (1.676 m)     Head Circumference --      Peak Flow --      Pain Score 12/19/22 1334 4     Pain Loc --      Pain Edu? --      Excl. in [place]? --     Most recent vital signs: Vitals:   12/19/22 1338  BP: 113/84  Pulse: 98  Resp: 20  Temp: 98 F (36.7 C)  SpO2: 96%    General: Awake, no distress.  CV:  Good peripheral perfusion.  Regular rate and rhythm  Resp:  Normal effort.  Equal breath sounds bilaterally.  No obvious wheeze rales or rhonchi. Abd:  No distention.  Soft, nontender.  No rebound or guarding. Other:  Slight pharyngeal erythema without exudate or tonsillar hypertrophy.  Mild rhinorrhea.   ED Results / Procedures / Treatments   RADIOLOGY  I have reviewed and interpreted chest x-ray images.  No obvious or large opacity seen on my evaluation. Radiology is read subtle opacity in the right suprahilar region possible developing  pneumonia.   MEDICATIONS ORDERED IN ED: Medications  sodium chloride 0.9 % bolus 1,000 mL (1,000 mLs Intravenous New Bag/Given 12/19/22 1417)  ketorolac (TORADOL) 30 MG/ML injection 30 mg (30 mg Intravenous Given 12/19/22 1418)  ondansetron (ZOFRAN) injection 4 mg (4 mg Intravenous Given 12/19/22 1419)     IMPRESSION / MDM / ASSESSMENT AND PLAN / ED COURSE  I reviewed the triage vital signs and the nursing notes.  Patient's presentation is most consistent with acute presentation with potential threat to life or bodily function.  Patient presents to the emergency department for 2 days of cough congestion subjective fever chills body aches.  Symptoms are very suggestive of upper respiratory infection or viral process or pneumonia.  We will check labs, will obtain a COVID swab, will obtain chest x-ray.  Will dose IV fluids Toradol and Zofran while awaiting results.  Patient agreeable to plan.  Patient's CBC is normal with a normal white blood cell count, chemistry is reassuring/normal.  Patient strep test is negative.  COVID test however is positive explaining the patient's symptoms.  Chest x-ray shows no large consolidation but does show a subtle opacity in the right side likely viral pneumonia due to the COVID.  Patient states last time  he had COVID 2 years ago he required hospitalization.  Reassuringly good vitals and lab results currently.  Will prescribe Paxlovid for the patient.  Discussed pros and cons they would like to proceed with Paxlovid.  Will also prescribe Zofran to be used if needed for any nausea discussed over-the-counter medications like Tylenol or ibuprofen fluids/supportive care.  FINAL CLINICAL IMPRESSION(S) / ED DIAGNOSES   COVID-19  Rx / DC Orders   Paxlovid Zofran  Note:  This document was prepared using Dragon voice recognition software and may include unintentional dictation errors.   Minna Antis, MD 12/19/22 1444

## 2022-12-27 ENCOUNTER — Ambulatory Visit
Admission: RE | Admit: 2022-12-27 | Discharge: 2022-12-27 | Disposition: A | Discharge status: Home / Self Care | Payer: Managed Care, Other (non HMO) | Source: Ambulatory Visit | Attending: Physician Assistant | Admitting: Physician Assistant

## 2022-12-27 ENCOUNTER — Ambulatory Visit
Admission: RE | Admit: 2022-12-27 | Discharge: 2022-12-27 | Disposition: A | Discharge status: Home / Self Care | Payer: Managed Care, Other (non HMO) | Attending: Physician Assistant | Admitting: Physician Assistant

## 2022-12-27 ENCOUNTER — Other Ambulatory Visit: Payer: Self-pay | Admitting: Physician Assistant

## 2022-12-27 DIAGNOSIS — R0602 Shortness of breath: Secondary | ICD-10-CM

## 2022-12-27 DIAGNOSIS — U071 COVID-19: Secondary | ICD-10-CM

## 2023-05-18 DIAGNOSIS — G8929 Other chronic pain: Secondary | ICD-10-CM | POA: Insufficient documentation

## 2023-05-18 DIAGNOSIS — D169 Benign neoplasm of bone and articular cartilage, unspecified: Secondary | ICD-10-CM | POA: Insufficient documentation

## 2023-06-22 ENCOUNTER — Other Ambulatory Visit: Payer: Self-pay

## 2023-06-22 ENCOUNTER — Observation Stay
Admission: RE | Admit: 2023-06-22 | Discharge: 2023-06-23 | Disposition: A | Discharge status: Home / Self Care | Payer: Managed Care, Other (non HMO) | Attending: Internal Medicine | Admitting: Internal Medicine

## 2023-06-22 ENCOUNTER — Encounter: Payer: Self-pay | Admitting: Internal Medicine

## 2023-06-22 ENCOUNTER — Encounter
Admission: RE | Disposition: A | Discharge status: Home / Self Care | Payer: Self-pay | Source: Home / Self Care | Attending: Internal Medicine

## 2023-06-22 DIAGNOSIS — Z7984 Long term (current) use of oral hypoglycemic drugs: Secondary | ICD-10-CM | POA: Insufficient documentation

## 2023-06-22 DIAGNOSIS — Z79899 Other long term (current) drug therapy: Secondary | ICD-10-CM | POA: Insufficient documentation

## 2023-06-22 DIAGNOSIS — I25119 Atherosclerotic heart disease of native coronary artery with unspecified angina pectoris: Principal | ICD-10-CM | POA: Insufficient documentation

## 2023-06-22 DIAGNOSIS — I10 Essential (primary) hypertension: Secondary | ICD-10-CM | POA: Insufficient documentation

## 2023-06-22 DIAGNOSIS — Z87891 Personal history of nicotine dependence: Secondary | ICD-10-CM | POA: Insufficient documentation

## 2023-06-22 DIAGNOSIS — Z7901 Long term (current) use of anticoagulants: Secondary | ICD-10-CM | POA: Insufficient documentation

## 2023-06-22 DIAGNOSIS — Z8616 Personal history of COVID-19: Secondary | ICD-10-CM | POA: Diagnosis not present

## 2023-06-22 DIAGNOSIS — E119 Type 2 diabetes mellitus without complications: Secondary | ICD-10-CM | POA: Insufficient documentation

## 2023-06-22 DIAGNOSIS — Z955 Presence of coronary angioplasty implant and graft: Secondary | ICD-10-CM | POA: Diagnosis not present

## 2023-06-22 DIAGNOSIS — Z794 Long term (current) use of insulin: Secondary | ICD-10-CM | POA: Insufficient documentation

## 2023-06-22 DIAGNOSIS — R9439 Abnormal result of other cardiovascular function study: Secondary | ICD-10-CM

## 2023-06-22 HISTORY — PX: CORONARY STENT INTERVENTION: CATH118234

## 2023-06-22 HISTORY — PX: LEFT HEART CATH AND CORONARY ANGIOGRAPHY: CATH118249

## 2023-06-22 LAB — POCT ACTIVATED CLOTTING TIME
Activated Clotting Time: 366 s
Activated Clotting Time: 268 s

## 2023-06-22 LAB — GLUCOSE, CAPILLARY
Glucose-Capillary: 96 mg/dL (ref 70–99)
Glucose-Capillary: 115 mg/dL — ABNORMAL HIGH (ref 70–99)
Glucose-Capillary: 97 mg/dL (ref 70–99)

## 2023-06-22 SURGERY — LEFT HEART CATH AND CORONARY ANGIOGRAPHY
Anesthesia: Moderate Sedation

## 2023-06-22 MED ORDER — HEPARIN (PORCINE) IN NACL 1000-0.9 UT/500ML-% IV SOLN
INTRAVENOUS | Status: AC
  Filled 2023-06-22: qty 1000

## 2023-06-22 MED ORDER — SODIUM CHLORIDE 0.9 % WEIGHT BASED INFUSION: 1.0000 mL/kg/h | INTRAVENOUS | Status: AC

## 2023-06-22 MED ORDER — TICAGRELOR 90 MG PO TABS
90.0000 mg | ORAL_TABLET | Freq: Two times a day (BID) | ORAL | Status: DC
Start: 2023-06-22 — End: 2023-06-23
  Administered 2023-06-22: 90 mg via ORAL
  Filled 2023-06-22: qty 1

## 2023-06-22 MED ORDER — VERAPAMIL HCL 2.5 MG/ML IV SOLN
INTRAVENOUS | Status: AC
  Filled 2023-06-22: qty 2

## 2023-06-22 MED ORDER — ASPIRIN 81 MG PO CHEW
CHEWABLE_TABLET | ORAL | Status: DC | PRN
  Administered 2023-06-22: 243 mg via ORAL

## 2023-06-22 MED ORDER — ASPIRIN 81 MG PO CHEW
81.0000 mg | CHEWABLE_TABLET | Freq: Every day | ORAL | Status: DC
  Administered 2023-06-23: 81 mg via ORAL
  Filled 2023-06-22: qty 1

## 2023-06-22 MED ORDER — ASPIRIN 81 MG PO CHEW: 81.0000 mg | CHEWABLE_TABLET | ORAL | Status: DC

## 2023-06-22 MED ORDER — MIDAZOLAM HCL 2 MG/2ML IJ SOLN
INTRAMUSCULAR | Status: AC
  Filled 2023-06-22: qty 2

## 2023-06-22 MED ORDER — EMPAGLIFLOZIN 25 MG PO TABS
25.0000 mg | ORAL_TABLET | Freq: Every day | ORAL | Status: DC
  Administered 2023-06-23: 25 mg via ORAL
  Filled 2023-06-22 (×2): qty 1

## 2023-06-22 MED ORDER — METFORMIN HCL 500 MG PO TABS
1000.0000 mg | ORAL_TABLET | Freq: Two times a day (BID) | ORAL | Status: DC
  Filled 2023-06-22: qty 2

## 2023-06-22 MED ORDER — HEPARIN SODIUM (PORCINE) 1000 UNIT/ML IJ SOLN
INTRAMUSCULAR | Status: AC
  Filled 2023-06-22: qty 10

## 2023-06-22 MED ORDER — ASPIRIN 81 MG PO CHEW
CHEWABLE_TABLET | ORAL | Status: AC
Start: 2023-06-22 — End: ?
  Filled 2023-06-22: qty 3

## 2023-06-22 MED ORDER — HEPARIN SODIUM (PORCINE) 1000 UNIT/ML IJ SOLN
INTRAMUSCULAR | Status: DC | PRN
  Administered 2023-06-22: 8000 [IU] via INTRAVENOUS
  Administered 2023-06-22: 5000 [IU] via INTRAVENOUS

## 2023-06-22 MED ORDER — APIXABAN 5 MG PO TABS
5.0000 mg | ORAL_TABLET | Freq: Two times a day (BID) | ORAL | Status: DC
  Administered 2023-06-23: 5 mg via ORAL
  Filled 2023-06-22 (×2): qty 1

## 2023-06-22 MED ORDER — FENTANYL CITRATE (PF) 100 MCG/2ML IJ SOLN
INTRAMUSCULAR | Status: DC | PRN
  Administered 2023-06-22: 25 ug via INTRAVENOUS
  Administered 2023-06-22: 50 ug via INTRAVENOUS

## 2023-06-22 MED ORDER — SODIUM CHLORIDE 0.9 % WEIGHT BASED INFUSION: 1.0000 mL/kg/h | INTRAVENOUS | Status: DC

## 2023-06-22 MED ORDER — FENTANYL CITRATE (PF) 100 MCG/2ML IJ SOLN
INTRAMUSCULAR | Status: AC
  Filled 2023-06-22: qty 2

## 2023-06-22 MED ORDER — SODIUM CHLORIDE 0.9% FLUSH: 3.0000 mL | INTRAVENOUS | Status: DC | PRN

## 2023-06-22 MED ORDER — SODIUM CHLORIDE 0.9 % IV SOLN: 250.0000 mL | INTRAVENOUS | Status: DC | PRN

## 2023-06-22 MED ORDER — SODIUM CHLORIDE 0.9% FLUSH
3.0000 mL | Freq: Two times a day (BID) | INTRAVENOUS | Status: DC
  Administered 2023-06-22: 3 mL via INTRAVENOUS

## 2023-06-22 MED ORDER — TICAGRELOR 90 MG PO TABS
ORAL_TABLET | ORAL | Status: AC
  Filled 2023-06-22: qty 2

## 2023-06-22 MED ORDER — TICAGRELOR 90 MG PO TABS
ORAL_TABLET | ORAL | Status: DC | PRN
  Administered 2023-06-22: 180 mg via ORAL

## 2023-06-22 MED ORDER — HEPARIN (PORCINE) IN NACL 1000-0.9 UT/500ML-% IV SOLN
INTRAVENOUS | Status: DC | PRN
  Administered 2023-06-22 (×2): 500 mL

## 2023-06-22 MED ORDER — PIOGLITAZONE HCL 30 MG PO TABS
30.0000 mg | ORAL_TABLET | Freq: Every day | ORAL | Status: DC
  Administered 2023-06-23: 30 mg via ORAL
  Filled 2023-06-22 (×2): qty 1

## 2023-06-22 MED ORDER — SODIUM CHLORIDE 0.9 % WEIGHT BASED INFUSION
3.0000 mL/kg/h | INTRAVENOUS | Status: AC
  Administered 2023-06-22: 3 mL/kg/h via INTRAVENOUS

## 2023-06-22 MED ORDER — MIDAZOLAM HCL 2 MG/2ML IJ SOLN
INTRAMUSCULAR | Status: DC | PRN
  Administered 2023-06-22 (×2): 1 mg via INTRAVENOUS

## 2023-06-22 MED ORDER — IOHEXOL 300 MG/ML  SOLN
INTRAMUSCULAR | Status: DC | PRN
  Administered 2023-06-22: 223 mL

## 2023-06-22 MED ORDER — ATORVASTATIN CALCIUM 20 MG PO TABS
40.0000 mg | ORAL_TABLET | Freq: Every day | ORAL | Status: DC
  Administered 2023-06-23: 40 mg via ORAL
  Filled 2023-06-22 (×2): qty 2

## 2023-06-22 MED ORDER — VERAPAMIL HCL 2.5 MG/ML IV SOLN
INTRAVENOUS | Status: DC | PRN
  Administered 2023-06-22 (×2): 2.5 mg via INTRA_ARTERIAL

## 2023-06-22 SURGICAL SUPPLY — 19 items
BALLN TREK RX 3.0X15 (BALLOONS) ×2
BALLN ~~LOC~~ TREK NEO RX 3.5X8 (BALLOONS) ×2
BALLOON TREK RX 3.0X15 (BALLOONS) IMPLANT
BALLOON ~~LOC~~ TREK NEO RX 3.5X8 (BALLOONS) IMPLANT
CATH 5FR JL3.5 JR4 ANG PIG MP (CATHETERS) IMPLANT
CATH VISTA GUIDE 6FR XB3.5 (CATHETERS) IMPLANT
DEVICE RAD TR BAND REGULAR (VASCULAR PRODUCTS) IMPLANT
DRAPE BRACHIAL (DRAPES) IMPLANT
GLIDESHEATH SLEND SS 6F .021 (SHEATH) IMPLANT
GUIDEWIRE INQWIRE 1.5J.035X260 (WIRE) IMPLANT
INQWIRE 1.5J .035X260CM (WIRE) ×2
KIT ENCORE 26 ADVANTAGE (KITS) IMPLANT
PACK CARDIAC CATH (CUSTOM PROCEDURE TRAY) ×2 IMPLANT
PROTECTION STATION PRESSURIZED (MISCELLANEOUS) ×2
SET ATX-X65L (MISCELLANEOUS) IMPLANT
STATION PROTECTION PRESSURIZED (MISCELLANEOUS) IMPLANT
STENT ONYX FRONTIER 3.0X15 (Permanent Stent) IMPLANT
TUBING CIL FLEX 10 FLL-RA (TUBING) IMPLANT
WIRE G HI TQ BMW 190 (WIRE) IMPLANT

## 2023-06-22 NOTE — Progress Notes (Signed)
TR band removed and pressure held for 20 minutes D/T small amount of constant oozing. Gauze and tegaderm dressing placed.

## 2023-06-22 NOTE — Progress Notes (Signed)
Patient came out of procedure with reverse barbeau of a D, provider aware according to the cath lab. Pulses palpable.

## 2023-06-23 DIAGNOSIS — Z955 Presence of coronary angioplasty implant and graft: Secondary | ICD-10-CM | POA: Diagnosis not present

## 2023-06-23 DIAGNOSIS — I25119 Atherosclerotic heart disease of native coronary artery with unspecified angina pectoris: Secondary | ICD-10-CM | POA: Diagnosis not present

## 2023-06-23 LAB — BASIC METABOLIC PANEL
Anion gap: 6 (ref 5–15)
BUN: 21 mg/dL — ABNORMAL HIGH (ref 6–20)
CO2: 24 mmol/L (ref 22–32)
Calcium: 8.5 mg/dL — ABNORMAL LOW (ref 8.9–10.3)
Chloride: 109 mmol/L (ref 98–111)
Creatinine, Ser: 0.9 mg/dL (ref 0.61–1.24)
GFR, Estimated: >60 mL/min (ref >60)
Glucose, Bld: 175 mg/dL — ABNORMAL HIGH (ref 70–99)
Potassium: 4 mmol/L (ref 3.5–5.1)
Sodium: 139 mmol/L (ref 135–145)

## 2023-06-23 LAB — CBC
HCT: 39.1 % (ref 39.0–52.0)
Hemoglobin: 13 g/dL (ref 13.0–17.0)
MCH: 30.6 pg (ref 26.0–34.0)
MCHC: 33.2 g/dL (ref 30.0–36.0)
MCV: 92 fL (ref 80.0–100.0)
Platelets: 146 10*3/uL — ABNORMAL LOW (ref 150–400)
RBC: 4.25 MIL/uL (ref 4.22–5.81)
RDW: 13.1 % (ref 11.5–15.5)
WBC: 5.1 10*3/uL (ref 4.0–10.5)
nRBC: 0 % (ref 0.0–0.2)

## 2023-06-23 LAB — LIPID PANEL
Cholesterol: 109 mg/dL (ref 0–200)
HDL: 33 mg/dL — ABNORMAL LOW (ref >40)
LDL Cholesterol: 51 mg/dL (ref 0–99)
Total CHOL/HDL Ratio: 3.3 {ratio}
Triglycerides: 124 mg/dL (ref <150)
VLDL: 25 mg/dL (ref 0–40)

## 2023-06-23 LAB — GLUCOSE, CAPILLARY: Glucose-Capillary: 114 mg/dL — ABNORMAL HIGH (ref 70–99)

## 2023-06-23 MED ORDER — CLOPIDOGREL BISULFATE 75 MG PO TABS: 75.0000 mg | ORAL_TABLET | Freq: Every day | ORAL | 0 refills | Status: DC

## 2023-06-23 MED ORDER — ASPIRIN 81 MG PO CHEW: 81.0000 mg | CHEWABLE_TABLET | Freq: Every day | ORAL | 0 refills | Status: DC

## 2023-06-23 MED ORDER — CLOPIDOGREL BISULFATE 75 MG PO TABS: 75.0000 mg | ORAL_TABLET | Freq: Every day | ORAL | Status: DC

## 2023-06-23 MED ORDER — CLOPIDOGREL BISULFATE 75 MG PO TABS
600.0000 mg | ORAL_TABLET | Freq: Once | ORAL | Status: AC
  Administered 2023-06-23: 600 mg via ORAL
  Filled 2023-06-23: qty 8

## 2023-06-23 NOTE — Discharge Summary (Signed)
Discharge Summary      Patient ID: Christopher Dennis MRN: [id number] DOB/AGE: [birth year] 57 y.o.  Admit date: 06/22/2023 Discharge date: 06/23/2023  Primary Discharge Diagnosis coronary artery disease with angina Secondary Discharge Diagnosis s/p percutaneous coronary intervention  Significant Diagnostic Studies: left heart catheterization  Consults: none  Hospital Course: The patient was brought to the cardiac cath lab and underwent left heart catheterization and coronary angiography with Dr. Juliann Pares on 06/22/23. The patient tolerated with procedure well without complications.  The patient received Onyx Frontier 3.0x15 stent to the LAD . On 10/31 the R wrist access site was examined and found to be without significant erythema, tenderness to palpation, or apparent aneurysm. Hospital course was overall uneventful, on day of discharge the patient was ambulatory and eager to go home.  Discussed cardiac rehab and new prescription medications in detail. Will plan for triple therapy with aspirin, Plavix, and Eliquis for two weeks, then patient with discontinue aspirin. The patient was given aftercare instructions and ER return precautions. Will arrange for follow up in office in 1 week, or sooner if needed.    Discharge Exam: Blood pressure 108/68, pulse 73, temperature 97.6 F (36.4 C), resp. rate 19, height 5\' 10"  (1.778 m), weight 106.6 kg, SpO2 98%.    General: Well appearing, well nourished, in no acute distress.  HEENT:  Normocephalic and atraumatic. Neck:   No JVD.  Lungs: Normal respiratory effort on room air.  Clear to ascultation bilaterally. Heart: HRRR . Normal S1 and S2 without gallops or murmurs.  Abdomen: non-distended appearing.  Msk: Normal strength and tone for age. Extremities: No peripheral edema. R radial access sight without bleeding, significant tenderness to palpation, apparent aneurysm, mild bruising noted. Covered with clean gauze and tegaderm dressing.   Neuro: Alert and oriented x3 Psych:  calm and cooperative.   Labs:   Lab Results  Component Value Date   WBC 5.1 06/23/2023   HGB 13.0 06/23/2023   HCT 39.1 06/23/2023   MCV 92.0 06/23/2023   PLT 146 (L) 06/23/2023    Recent Labs  Lab 06/23/23 0623  NA 139  K 4.0  CL 109  CO2 24  BUN 21*  CREATININE 0.90  CALCIUM 8.5*  GLUCOSE 175*      Radiology: none EKG: NSR rate 74 bpm  FOLLOW UP PLANS AND APPOINTMENTS Discharge Instructions     AMB Referral to Cardiac Rehabilitation - Phase II   Complete by: As directed    Diagnosis: Coronary Stents   After initial evaluation and assessments completed: Virtual Based Care may be provided alone or in conjunction with Phase 2 Cardiac Rehab based on patient barriers.: Yes      Allergies as of 06/23/2023   No Known Allergies      Medication List     STOP taking these medications    diltiazem 240 MG 24 hr capsule Commonly known as: Cartia XT       TAKE these medications    apixaban 5 MG Tabs tablet Commonly known as: ELIQUIS Take 1 tablet (5 mg total) by mouth 2 (two) times daily.   aspirin 81 MG chewable tablet Chew 1 tablet (81 mg total) by mouth daily.   atorvastatin 40 MG tablet Commonly known as: LIPITOR Take 1 tablet (40 mg total) by mouth daily.   clopidogrel 75 MG tablet Commonly known as: PLAVIX Take 1 tablet (75 mg total) by mouth daily. Start taking on: June 24, 2023   empagliflozin 25 MG Tabs tablet Commonly  known as: JARDIANCE Take 25 mg by mouth daily.   metFORMIN 1000 MG tablet Commonly known as: GLUCOPHAGE Take 1,000 mg by mouth 2 (two) times daily.   Ozempic (0.25 or 0.5 MG/DOSE) 2 MG/1.5ML Sopn Generic drug: Semaglutide(0.25 or 0.5MG /DOS) Inject 0.5 mg into the skin.   pioglitazone 30 MG tablet Commonly known as: ACTOS Take 30 mg by mouth daily.   Vitamin D (Ergocalciferol) 1.25 MG (50000 UNIT) Caps capsule Commonly known as: DRISDOL Take 50,000 Units by mouth once a  week.   Xultophy 100-3.6 UNIT-MG/ML Sopn Generic drug: Insulin Degludec-Liraglutide Inject 50 Units into the skin at bedtime.        Follow-up Information     Alluri, Meryl Dare, MD. Go in 1 week(s).   Specialty: Cardiology Contact information: [address] [place] [address] [phone]                 PLEASE BRING ALL MEDICATIONS WITH YOU TO FOLLOW UP APPOINTMENTS  Time spent with patient: 54 Signed:  Gale Journey PA-C 06/23/2023, 8:43 AM [facility]

## 2023-06-23 NOTE — Plan of Care (Signed)
  Problem: Education: Goal: Understanding of CV disease, CV risk reduction, and recovery process will improve Outcome: Progressing   Problem: Activity: Goal: Ability to return to baseline activity level will improve Outcome: Progressing   

## 2023-06-24 ENCOUNTER — Encounter: Payer: Self-pay | Admitting: Internal Medicine

## 2023-06-24 LAB — LIPOPROTEIN A (LPA): Lipoprotein (a): 12.4 nmol/L (ref <75.0)

## 2023-06-24 LAB — CARDIAC CATHETERIZATION: Cath EF Quantitative: 55 %

## 2024-02-27 ENCOUNTER — Ambulatory Visit: Admitting: Family Medicine

## 2024-02-27 ENCOUNTER — Encounter: Payer: Self-pay | Admitting: Family Medicine

## 2024-02-27 VITALS — BP 122/80 | HR 74 | Temp 97.6°F | Resp 18 | Ht 70.0 in | Wt 250.0 lb

## 2024-02-27 DIAGNOSIS — E785 Hyperlipidemia, unspecified: Secondary | ICD-10-CM

## 2024-02-27 DIAGNOSIS — M180 Bilateral primary osteoarthritis of first carpometacarpal joints: Secondary | ICD-10-CM

## 2024-02-27 DIAGNOSIS — I4891 Unspecified atrial fibrillation: Secondary | ICD-10-CM | POA: Diagnosis not present

## 2024-02-27 DIAGNOSIS — Z1211 Encounter for screening for malignant neoplasm of colon: Secondary | ICD-10-CM

## 2024-02-27 DIAGNOSIS — R7989 Other specified abnormal findings of blood chemistry: Secondary | ICD-10-CM

## 2024-02-27 DIAGNOSIS — E1165 Type 2 diabetes mellitus with hyperglycemia: Secondary | ICD-10-CM | POA: Diagnosis not present

## 2024-02-27 DIAGNOSIS — I2585 Chronic coronary microvascular dysfunction: Secondary | ICD-10-CM

## 2024-02-27 NOTE — Assessment & Plan Note (Addendum)
 On Lipitor 40 mg.  02/07/2024: Total cholesterol 143, Triggs 98, HDL 50 and LDL 73.  Goal is LDL 70 or less.

## 2024-02-27 NOTE — Assessment & Plan Note (Signed)
 Has a follow-up in 3 months.  Will check CMP at that time.

## 2024-02-27 NOTE — Progress Notes (Signed)
 New Patient Office Visit  Subjective    Patient ID: Christopher Dennis, male    DOB: 1966-01-01  Age: 58 y.o. MRN: [id number]  CC:  Chief Complaint  Patient presents with   Establish Care   Hand Pain    Left    HPI Christopher Dennis presents to establish care Delightful 58 year old male with paroxysmal atrial fibrillation, DMT2, hyperlipidemia, CAD (LHC 10/24: DES to the proximal LAD, previous mid RCA stent. LVEF 55 to 65% 10/24), HTN, Hx LFTs, DDD lumbar (surgery 2013) and pain in both hands. He has degenerative disc disease in his back and had surgery in 2013.  Reports his back has been great since then.  Denies peripheral neuropathy and radiculopathy.      Has atrial fibrillation and is on Eliquis  5 mg twice daily.  No ill effects from the atrial fibrillation.  Reports no black tarry stools or bright red blood per rectum.      Has CAD and had a second stent placed 1024.  He is on Plavix  75 mg daily.  He does not take an 81 mg aspirin  daily.  Sees cardiology about every 6 months.  Denies any chest pain with exertion.      Has DMT2 and is on Jardiance  25 mg daily.  Metformin  1000 mg twice daily, Actos  30 mg daily and Tresiba liraglutide (100-3.6 units) 62 units once daily.  Checks his feet daily.  Wear shoes in the house.  Cuts his own toenails.  Has not seen a podiatrist and does not have insoles.  Has not seen the ophthalmologist this year.  Reports no episodes of low blood sugar including confusion, headache, shakiness, diaphoresis.  Labs 02/07/2024: Total cholesterol 143, Triggs 98, HDL 50, LDL 73, A1c 6.2%, microalbumin creatinine ratio 7.8, creatinine 1.0, TSH 2.254.      Quit smoking 15 years ago after 25 years at a half a pack per day so he has a 12.5-pack-year history of smoking.      Takes vitamin D  50,000 units once monthly. Has pain in his hands primarily his thumbs at the [facility] joint left thumb is more painful than right although he is right-hand dominant.  No decreased  range of motion but definite decrease in strength.  Has trouble opening screw on bottle caps. Colonoscopy 05/12/2015 with a 10-year follow-up   Outpatient Encounter Medications as of 02/27/2024  Medication Sig   apixaban  (ELIQUIS ) 5 MG TABS tablet Take 1 tablet (5 mg total) by mouth 2 (two) times daily.   atorvastatin  (LIPITOR) 40 MG tablet Take 1 tablet (40 mg total) by mouth daily.   clopidogrel  (PLAVIX ) 75 MG tablet Take 1 tablet (75 mg total) by mouth daily.   Continuous Glucose Sensor (FREESTYLE LIBRE 3 PLUS SENSOR) MISC Place 1 each onto the skin every 14 (fourteen) days.   empagliflozin  (JARDIANCE ) 25 MG TABS tablet Take 25 mg by mouth daily.    Insulin  Degludec-Liraglutide (XULTOPHY) 100-3.6 UNIT-MG/ML SOPN Inject 50 Units into the skin at bedtime.  (Patient taking differently: Inject 62 Units into the skin at bedtime.)   lisinopril  (ZESTRIL ) 20 MG tablet Take 20 mg by mouth daily.   metFORMIN  (GLUCOPHAGE ) 1000 MG tablet Take 1,000 mg by mouth 2 (two) times daily.    metoprolol tartrate (LOPRESSOR) 25 MG tablet Take 25 mg by mouth 2 (two) times daily. (Patient taking differently: Take 25 mg by mouth daily.)   OZEMPIC, 0.25 OR 0.5 MG/DOSE, 2 MG/1.5ML SOPN Inject 0.5 mg into the skin.  pioglitazone  (ACTOS ) 30 MG tablet Take 30 mg by mouth daily.    Vitamin D , Ergocalciferol , (DRISDOL ) 1.25 MG (50000 UT) CAPS capsule Take 50,000 Units by mouth once a week.    [DISCONTINUED] aspirin  81 MG chewable tablet Chew 1 tablet (81 mg total) by mouth daily. (Patient not taking: Reported on 02/27/2024)   No facility-administered encounter medications on file as of 02/27/2024.    Past Medical History:  Diagnosis Date   Abnormal LFTs 12/04/2012   Arteriosclerosis of coronary artery 12/04/2012   Back ache 12/04/2012   BP (high blood pressure) 12/04/2012   Clinical depression 12/04/2012   DDD (degenerative disc disease), lumbosacral 12/11/2012   Diabetes (HCC)    Displacement of lumbar intervertebral disc  without myelopathy 12/11/2012   HLD (hyperlipidemia) 12/04/2012   Lumbar canal stenosis 12/11/2012   Neuropathy 12/04/2012   Sleep apnea    CPAP - hasn't been using, no sure if it works   IT consultant and lumbosacral neuritis 12/11/2012    Past Surgical History:  Procedure Laterality Date   BACK SURGERY  2000   COLONOSCOPY WITH PROPOFOL  N/A 05/12/2015   Procedure: COLONOSCOPY WITH PROPOFOL ;  Surgeon: Rogelia Copping, MD;  Location: [facility];  Service: Endoscopy;  Laterality: N/A;   CORONARY ANGIOPLASTY WITH STENT PLACEMENT  06/17/2011   CORONARY STENT INTERVENTION N/A 06/22/2023   Procedure: CORONARY STENT INTERVENTION;  Surgeon: Florencio Cara BIRCH, MD;  Location: [facility] INVAS[facility] CV LAB;  Service: Cardiovascular;  Laterality: N/A;   ESOPHAGOGASTRODUODENOSCOPY (EGD) WITH PROPOFOL  N/A 05/12/2015   Procedure: ESOPHAGOGASTRODUODENOSCOPY (EGD) WITH PROPOFOL ;  Surgeon: Rogelia Copping, MD;  Location: [facility];  Service: Endoscopy;  Laterality: N/A;  CPAP   KNEE ARTHROSCOPY Right 09/19/2014   Laminectomy posterior cervicle decomp     laminectomy posterior lumbar facetectomy & Foraminotomy w/decomp  12/15/2012   LEFT HEART CATH AND CORONARY ANGIOGRAPHY Left 06/22/2023   Procedure: LEFT HEART CATH AND CORONARY ANGIOGRAPHY;  Surgeon: Florencio Cara BIRCH, MD;  Location: [facility] INVASIVE CV LAB;  Service: Cardiovascular;  Laterality: Left;   meniscus tear  2006   partial medial and extensive lateral meniscectomy     POLYPECTOMY  05/12/2015   Procedure: POLYPECTOMY;  Surgeon: Rogelia Copping, MD;  Location: [facility];  Service: Endoscopy;;    Family History  Problem Relation Age of Onset   Diabetes Mother    COPD Mother    Alcohol abuse Father    Asthma Sister     Social History   Socioeconomic History   Marital status: Married    Spouse name: Not on file   Number of children: Not on file   Years of education: Not on file   Highest education level: Not on file  Occupational History    Not on file  Tobacco Use   Smoking status: Former    Passive exposure: Past   Smokeless tobacco: Current    Types: Snuff  Substance and Sexual Activity   Alcohol use: Yes    Alcohol/week: 0.0 standard drinks of alcohol    Comment: rarely - 1 beer/month   Drug use: No   Sexual activity: Not on file  Other Topics Concern   Not on file  Social History Narrative   Not on file   Social Drivers of Health   Financial Resource Strain: Low Risk  (06/01/2023)   Received from [facility]   Overall Financial Resource Strain (CARDIA)    Difficulty of Paying Living Expenses: Not very hard  Food Insecurity: No Food  Insecurity (06/22/2023)   Hunger Vital Sign    Worried About Running Out of Food in the Last Year: Never true    Ran Out of Food in the Last Year: Never true  Transportation Needs: No Transportation Needs (06/22/2023)   PRAPARE - Administrator, Civil Service (Medical): No    Lack of Transportation (Non-Medical): No  Physical Activity: Sufficiently Active (06/01/2023)   Received from [facility]   Exercise Vital Sign    On average, how many days per week do you engage in moderate to strenuous exercise (like a brisk walk)?: 3 days    On average, how many minutes do you engage in exercise at this level?: 60 min  Stress: No Stress Concern Present (06/01/2023)   Received from [facility] - Occupational Stress Questionnaire    Feeling of Stress : Only a little  Social Connections: Moderately Isolated (06/01/2023)   Received from [facility]   Social Connection and Isolation Panel    In a typical week, how many times do you talk on the phone with family, friends, or neighbors?: More than three times a week    How often do you get together with friends or relatives?: Once a week    How often do you attend church or religious services?: Never    Do you belong  to any clubs or organizations such as church groups, unions, fraternal or athletic groups, or school groups?: No    How often do you attend meetings of the clubs or organizations you belong to?: Never    Are you married, widowed, divorced, separated, never married, or living with a partner?: Married  Intimate Partner Violence: Not At Risk (06/22/2023)   Humiliation, Afraid, Rape, and Kick questionnaire    Fear of Current or Ex-Partner: No    Emotionally Abused: No    Physically Abused: No    Sexually Abused: No    ROS      Objective   BP 122/80 (BP Location: Left Arm, Patient Position: Sitting, Cuff Size: Normal)   Pulse 74   Temp 97.6 F (36.4 C) (Oral)   Resp 18   Ht 5' 10 (1.778 m)   Wt 250 lb (113.4 kg)   SpO2 97%   BMI 35.87 kg/m    Physical Exam Vitals and nursing note reviewed.  Constitutional:      Appearance: Normal appearance.  HENT:     Head: Normocephalic and atraumatic.  Eyes:     Conjunctiva/sclera: Conjunctivae normal.  Cardiovascular:     Rate and Rhythm: Normal rate and regular rhythm.  Pulmonary:     Effort: Pulmonary effort is normal.     Breath sounds: Normal breath sounds.  Musculoskeletal:     Right lower leg: No edema.     Left lower leg: No edema.  Feet:     Right foot:     Protective Sensation: 5 sites tested.  5 sites sensed.     Left foot:     Protective Sensation: 5 sites tested.  5 sites sensed.  Skin:    General: Skin is warm and dry.  Neurological:     Mental Status: He is alert and oriented to person, place, and time.  Psychiatric:        Mood and Affect: Mood normal.        Behavior: Behavior normal.        Thought Content: Thought content  normal.        Judgment: Judgment normal.            The ASCVD Risk score (Arnett DK, et al., 2019) failed to calculate for the following reasons:   The valid total cholesterol range is 130 to 320 mg/dL     Assessment & Plan:  Arthritis of carpometacarpal [facility]) joint of both  thumbs -     Ambulatory referral to Orthopedic Surgery  Atrial fibrillation with RVR (HCC) Assessment & Plan: On Eliquis  5 mg twice daily and metoprolol tartrate 25 mg daily.  Has follow-up in 3 months.  Will check CBC at that time.   Special screening for malignant neoplasms, colon Assessment & Plan: Colonoscopy - 05/12/2015.  10-year follow-up.   Hyperlipidemia, unspecified hyperlipidemia type Assessment & Plan: On Lipitor 40 mg.  02/07/2024: Total cholesterol 143, Triggs 98, HDL 50 and LDL 73.  Goal is LDL 70 or less.   Chronic coronary microvascular dysfunction Assessment & Plan: Initial stent placement RCA (date unknown) followed by LAD stent 05/2023.   Uncontrolled type 2 diabetes mellitus with hyperglycemia, without long-term current use of insulin  [facility]) Assessment & Plan: Reminded patient to see ophthalmologist annually.  Discussed wearing shoes in the house and checking his feet daily.  02/07/2024 microalbumin creatinine ratio 7.8, A1c 6.2%. Tresiba liraglutide (100-3.6 units) 62 units once a day, Jardiance  25 mg daily, metformin  1000 twice daily and pioglitazone  30 mg daily.  Reports no episodes of low blood sugar including confusion, headache, shakiness and diaphoresis. Discussed walking 8000-10,000 steps per day.   Elevated LFTs Assessment & Plan: Has a follow-up in 3 months.  Will check CMP at that time.     Return in about 4 weeks (around 03/26/2024).   Christopher Huntley K Dafney Farler, MD

## 2024-02-27 NOTE — Assessment & Plan Note (Addendum)
 Reminded patient to see ophthalmologist annually.  Discussed wearing shoes in the house and checking his feet daily.  02/07/2024 microalbumin creatinine ratio 7.8, A1c 6.2%. Tresiba liraglutide (100-3.6 units) 62 units once a day, Jardiance  25 mg daily, metformin  1000 twice daily and pioglitazone  30 mg daily.  Reports no episodes of low blood sugar including confusion, headache, shakiness and diaphoresis. Discussed walking 8000-10,000 steps per day.

## 2024-02-27 NOTE — Assessment & Plan Note (Signed)
 Initial stent placement RCA (date unknown) followed by LAD stent 05/2023.

## 2024-02-27 NOTE — Assessment & Plan Note (Addendum)
 On Eliquis  5 mg twice daily and metoprolol tartrate 25 mg daily.  Has follow-up in 3 months.  Will check CBC at that time.

## 2024-02-27 NOTE — Assessment & Plan Note (Signed)
 Colonoscopy - 05/12/2015.  10-year follow-up.

## 2024-03-12 ENCOUNTER — Ambulatory Visit: Admitting: Orthopedic Surgery

## 2024-04-05 ENCOUNTER — Ambulatory Visit: Admitting: Orthopedic Surgery

## 2024-04-06 ENCOUNTER — Other Ambulatory Visit: Payer: Self-pay | Admitting: Surgical

## 2024-04-06 ENCOUNTER — Other Ambulatory Visit: Payer: Self-pay | Admitting: Orthopedic Surgery

## 2024-04-06 ENCOUNTER — Ambulatory Visit
Admission: RE | Admit: 2024-04-06 | Discharge: 2024-04-06 | Disposition: A | Discharge status: Home / Self Care | Attending: Orthopedic Surgery | Admitting: Orthopedic Surgery

## 2024-04-06 ENCOUNTER — Ambulatory Visit
Admission: RE | Admit: 2024-04-06 | Discharge: 2024-04-06 | Disposition: A | Discharge status: Home / Self Care | Source: Ambulatory Visit | Attending: Surgical | Admitting: Surgical

## 2024-04-06 DIAGNOSIS — M79645 Pain in left finger(s): Secondary | ICD-10-CM | POA: Diagnosis present

## 2024-04-06 DIAGNOSIS — M79644 Pain in right finger(s): Secondary | ICD-10-CM

## 2024-04-10 ENCOUNTER — Ambulatory Visit: Admitting: Orthopedic Surgery

## 2024-04-10 ENCOUNTER — Encounter: Payer: Self-pay | Admitting: Orthopedic Surgery

## 2024-04-10 DIAGNOSIS — M180 Bilateral primary osteoarthritis of first carpometacarpal joints: Secondary | ICD-10-CM

## 2024-04-10 DIAGNOSIS — M79644 Pain in right finger(s): Secondary | ICD-10-CM

## 2024-04-10 DIAGNOSIS — M79645 Pain in left finger(s): Secondary | ICD-10-CM

## 2024-04-10 MED ORDER — MELOXICAM 15 MG PO TABS: 15.0000 mg | ORAL_TABLET | Freq: Every day | ORAL | 1 refills | Status: AC

## 2024-04-16 NOTE — Progress Notes (Unsigned)
 Office Visit Note   Patient: Christopher Dennis           Date of Birth: Dec 28, 1965           MRN: [id number] Visit Date: 04/10/2024              Requested by: Ziglar, Susan K, MD [address] [place],  [place] [address] PCP: Ziglar, Susan K, MD   Assessment & Plan: Visit Diagnoses:  1. Thumb pain, left   2. Thumb pain, right   3. Arthritis of carpometacarpal (CMC) joint of both thumbs     Plan: Patient will continue to use over-the-counter braces as necessary to reduce pain and increase stability of the thumbs.  He will begin an anti-inflammatory trial.  He will return to see Dr. Agarwala for follow-up check and further treatment planning.  Certainly if the Mobic  that was prescribed today does not significantly decrease his pain in either the thumbs or the trigger finger further intervention will be warranted.  Follow-Up Instructions: No follow-ups on file.   Orders:  Orders Placed This Encounter  Procedures   Ambulatory referral to Orthopedic Surgery   Meds ordered this encounter  Medications   meloxicam  (MOBIC ) 15 MG tablet    Sig: Take 1 tablet (15 mg total) by mouth daily.    Dispense:  30 tablet    Refill:  1      Procedures: No procedures performed   Clinical Data: No additional findings.   Subjective: Chief Complaint  Patient presents with   Hand Pain    B thumb pain, L > R, RHD, several year history of pain, worsening lately.  Hurts to grab simple things, like coffee cup.  Also left middle finger hurts at night, if it curls, hard to undo, not triggering.      Hand Pain     Review of Systems   Objective: Vital Signs: There were no vitals taken for this visit.  Physical Exam  Ortho Exam examination of the patient's hands reveals no obvious deformity or atrophy.  Patient's grip strength is within normal limits but causes significant pain at the base of his thumb into the metacarpal and to the wrist.  This is most prominent at the [facility] joint of  both hands.  He is able to resist motion of the thumb but this does cause pain.  He is otherwise neurovascularly intact in all digits.  No obvious laxity or instability of the joint.  Minimal tenderness over the snuffbox bilaterally particular with range of motion of the thumb.  No obvious effusion of either wrist.  Minimal bogginess to palpation along the [facility] joint of both hands.  Examination of the middle finger of his left hand does show minimal triggering with a palpable nodule of the flexor tendon.  X-rays taken today show moderate loss of joint space at the [facility] joint bilaterally  Specialty Comments:  No specialty comments available.  Imaging: No results found.   PMFS History: Patient Active Problem List   Diagnosis Date Noted   S/P drug eluting coronary stent placement 06/22/2023   Atrial fibrillation with RVR (HCC) 03/08/2021   Uncontrolled type 2 diabetes mellitus with hyperglycemia, without long-term current use of insulin  (HCC) 03/02/2018   Gastritis    Stricture and stenosis of esophagus    Special screening for malignant neoplasms, colon    Fourth degree hemorrhoids    DDD (degenerative disc disease), lumbosacral 12/11/2012   Thoracic or lumbosacral neuritis or radiculitis, unspecified 12/11/2012  CAD (coronary artery disease) 12/04/2012   Clinical depression 12/04/2012   Elevated LFTs 12/04/2012   HTN (hypertension) 12/04/2012   Hyperlipidemia 12/04/2012   Hyponatremia 12/04/2012   Past Medical History:  Diagnosis Date   Abnormal LFTs 12/04/2012   Arteriosclerosis of coronary artery 12/04/2012   Back ache 12/04/2012   BP (high blood pressure) 12/04/2012   Clinical depression 12/04/2012   DDD (degenerative disc disease), lumbosacral 12/11/2012   Diabetes (HCC)    Displacement of lumbar intervertebral disc without myelopathy 12/11/2012   HLD (hyperlipidemia) 12/04/2012   Lumbar canal stenosis 12/11/2012   Neuropathy 12/04/2012   Sleep apnea    CPAP - hasn't been using, no  sure if it works   IT consultant and lumbosacral neuritis 12/11/2012    Family History  Problem Relation Age of Onset   Diabetes Mother    COPD Mother    Alcohol abuse Father    Asthma Sister     Past Surgical History:  Procedure Laterality Date   BACK SURGERY  2000   COLONOSCOPY WITH PROPOFOL  N/A 05/12/2015   Procedure: COLONOSCOPY WITH PROPOFOL ;  Surgeon: Rogelia Copping, MD;  Location: [facility];  Service: Endoscopy;  Laterality: N/A;   CORONARY ANGIOPLASTY WITH STENT PLACEMENT  06/17/2011   CORONARY STENT INTERVENTION N/A 06/22/2023   Procedure: CORONARY STENT INTERVENTION;  Surgeon: Florencio Cara BIRCH, MD;  Location: [facility] IN[facility] CV LAB;  Service: Cardiovascular;  Laterality: N/A;   ESOPHAGOGASTRODUODENOSCOPY (EGD) WITH PROPOFOL  N/A 05/12/2015   Procedure: ESOPHAGOGASTRODUODENOSCOPY (EGD) WITH PROPOFOL ;  Surgeon: Rogelia Copping, MD;  Location: [facility];  Service: Endoscopy;  Laterality: N/A;  CPAP   KNEE ARTHROSCOPY Right 09/19/2014   Laminectomy posterior cervicle decomp     laminectomy posterior lumbar facetectomy & Foraminotomy w/decomp  12/15/2012   LEFT HEART CATH AND CORONARY ANGIOGRAPHY Left 06/22/2023   Procedure: LEFT HEART CATH AND CORONARY ANGIOGRAPHY;  Surgeon: Florencio Cara BIRCH, MD;  Location: [facility];  Service: Cardiovascular;  Laterality: Left;   meniscus tear  2006   partial medial and extensive lateral meniscectomy     POLYPECTOMY  05/12/2015   Procedure: POLYPECTOMY;  Surgeon: Rogelia Copping, MD;  Location: [facility];  Service: Endoscopy;;   Social History   Occupational History   Not on file  Tobacco Use   Smoking status: Former    Passive exposure: Past   Smokeless tobacco: Current    Types: Snuff  Substance and Sexual Activity   Alcohol use: Yes    Alcohol/week: 0.0 standard drinks of alcohol    Comment: rarely - 1 beer/month   Drug use: No   Sexual activity: Not on file

## 2024-05-02 ENCOUNTER — Ambulatory Visit: Admitting: Orthopedic Surgery

## 2024-05-02 DIAGNOSIS — M180 Bilateral primary osteoarthritis of first carpometacarpal joints: Secondary | ICD-10-CM | POA: Diagnosis not present

## 2024-05-02 MED ORDER — LIDOCAINE HCL 1 % IJ SOLN
1.0000 mL | INTRAMUSCULAR | Status: AC | PRN
  Administered 2024-05-02: 1 mL

## 2024-05-02 MED ORDER — BETAMETHASONE SOD PHOS & ACET 6 (3-3) MG/ML IJ SUSP
6.0000 mg | INTRAMUSCULAR | Status: AC | PRN
  Administered 2024-05-02: 6 mg via INTRA_ARTICULAR

## 2024-05-02 NOTE — Progress Notes (Signed)
 Christopher Dennis - 58 y.o. male MRN [id number]  Date of birth: 1966/01/05  Office Visit Note: Visit Date: 05/02/2024 PCP: Ziglar, Susan K, MD Referred by: Henry Slater NOVAK, PA-C  Subjective: No chief complaint on file.  HPI: Christopher Dennis is a pleasant 58 y.o. male who presents today as a referral for specific hand surgical evaluation for ongoing bilateral thumb CMC arthritis.  Since it has been present now for multiple years, worsening in nature, symptoms greater on the left than the right.  History of activity modification, bracing and meloxicam  with mild relief.  No prior injections.  Pertinent ROS were reviewed with the patient and found to be negative unless otherwise specified above in HPI.   Visit Reason: Bilateral thumb pain, bilateral CMC arthritis L>R Duration of symptoms: 2 years Hand dominance: right Occupation: Field seismologist  Diabetic: Yes Smoking: No Heart/Lung History: Blood Thinners: CAD (coronary artery disease) HTN (hypertension) Fourth degree hemorrhoids Atrial fibrillation with RVR (HCC)    Prior Testing/EMG: x-rays Injections (Date): none Treatments: meloxicam - some relief Prior Surgery: ganglion cyst removal right middle finger Left wrist ganglion cyst removal  Assessment & Plan: Visit Diagnoses:  1. Arthritis of carpometacarpal (CMC) joint of both thumbs     Plan: Extensive discussion was had with the patient today regarding his bilateral thumb basilar joint pain.  X-rays from prior confirm diagnosis of ongoing bilateral thumb CMC arthritis which correlates with clinical examination.  We reviewed the etiology and pathophysiology of this condition as well as appropriate treatment modalities ranging from conservative to surgical.  From a conservative standpoint, we discussed bracing, activity modification, nonsteroidal anti-inflammatory medications both oral and topical, and finally cortisone injections.  From surgical standpoint, we  discussed the possibility for bilateral, staged thumb CMC arthroplasty in the future should symptoms refractory to conservative care.  I discussed the surgical treatment as well as the postop protocol in detail with him as well today for understanding.  At this juncture, given that he has not undergone any formalized treatment, we will have him fitted for bilateral Comfort Cool brace and will perform cortisone injection to the bilateral thumb CMC intervals today for symptom relief.  Risk and benefits of the injection were discussed in detail, patient elected to proceed.  Injections were performed under ultrasound guidance and was tolerated well without complaint.  Follow-up in 6 weeks.  I spent 45 minutes in the care of this patient today including review of previous documentation, imaging obtained, face-to-face time discussing all options regarding treatment and documenting the encounter.    Follow-up: No follow-ups on file.   Meds & Orders: No orders of the defined types were placed in this encounter.  No orders of the defined types were placed in this encounter.    Procedures: Hand/UE Inj: bilateral thumb CMC for osteoarthritis on 05/02/2024 1:11 PM Indications: pain Details: 25 G needle, ultrasound-guided Medications (Right): 1 mL lidocaine  1 %; 6 mg betamethasone  acetate-betamethasone  sodium phosphate  6 (3-3) MG/ML Medications (Left): 1 mL lidocaine  1 %; 6 mg betamethasone  acetate-betamethasone  sodium phosphate  6 (3-3) MG/ML Outcome: tolerated well, no immediate complications Procedure, treatment alternatives, risks and benefits explained, specific risks discussed.          Clinical History: No specialty comments available.  He reports that he has quit smoking. He has been exposed to tobacco smoke. His smokeless tobacco use includes snuff. No results for input(s): HGBA1C, LABURIC in the last 8760 hours.  Objective:   Vital Signs: There were no vitals taken  for this  visit.  Physical Exam  Gen: Well-appearing, in no acute distress; non-toxic CV: Regular Rate. Well-perfused. Warm.  Resp: Breathing unlabored on room air; no wheezing. Psych: Fluid speech in conversation; appropriate affect; normal thought process  Ortho Exam General: Patient is well appearing and in no distress.    Skin and Muscle: No skin changes are apparent to upper extremities.  Muscle bulk and contour normal, no signs of atrophy.      Range of Motion and Palpation Tests: Mobility is full about the elbows with flexion and extension.  Forearm supination and pronation are 85/85 bilaterally.  Wrist flexion/extension is 75/65 bilaterally.  Digital flexion and extension are full.  Thumb opposition is base of the ring finger bilaterally.  No cords or nodules are palpated.  No triggering is observed.     Significant tenderness over the bilateral thumb CMC articulation is observed, positive grind for pain, positive crepitus.  MP hyperextension negative right, approximate 10 degrees left.    Finklestein test mildly positive bilateral   Neurologic, Vascular, Motor: Sensation is intact to light touch in the median/radial/ulnar distributions Fingers pink and well perfused.  Capillary refill is brisk.     Imaging: No results found. Prior bilateral thumb x-rays were reviewed which show significant degenerative change at the thumb CMC interval with joint space narrowing, osteophyte formation and subchondral sclerosis  Past Medical/Family/Surgical/Social History: Medications & Allergies reviewed per EMR, new medications updated. Patient Active Problem List   Diagnosis Date Noted   S/P drug eluting coronary stent placement 06/22/2023   Atrial fibrillation with RVR (HCC) 03/08/2021   Uncontrolled type 2 diabetes mellitus with hyperglycemia, without long-term current use of insulin  (HCC) 03/02/2018   Gastritis    Stricture and stenosis of esophagus    Special screening for malignant  neoplasms, colon    Fourth degree hemorrhoids    DDD (degenerative disc disease), lumbosacral 12/11/2012   Thoracic or lumbosacral neuritis or radiculitis, unspecified 12/11/2012   CAD (coronary artery disease) 12/04/2012   Clinical depression 12/04/2012   Elevated LFTs 12/04/2012   HTN (hypertension) 12/04/2012   Hyperlipidemia 12/04/2012   Hyponatremia 12/04/2012   Past Medical History:  Diagnosis Date   Abnormal LFTs 12/04/2012   Arteriosclerosis of coronary artery 12/04/2012   Back ache 12/04/2012   BP (high blood pressure) 12/04/2012   Clinical depression 12/04/2012   DDD (degenerative disc disease), lumbosacral 12/11/2012   Diabetes (HCC)    Displacement of lumbar intervertebral disc without myelopathy 12/11/2012   HLD (hyperlipidemia) 12/04/2012   Lumbar canal stenosis 12/11/2012   Neuropathy 12/04/2012   Sleep apnea    CPAP - hasn't been using, no sure if it works   IT consultant and lumbosacral neuritis 12/11/2012   Family History  Problem Relation Age of Onset   Diabetes Mother    COPD Mother    Alcohol abuse Father    Asthma Sister    Past Surgical History:  Procedure Laterality Date   BACK SURGERY  2000   COLONOSCOPY WITH PROPOFOL  N/A 05/12/2015   Procedure: COLONOSCOPY WITH PROPOFOL ;  Surgeon: Rogelia Copping, MD;  Location: [facility];  Service: Endoscopy;  Laterality: N/A;   CORONARY ANGIOPLASTY WITH STENT PLACEMENT  06/17/2011   CORONARY STENT INTERVENTION N/A 06/22/2023   Procedure: CORONARY STENT INTERVENTION;  Surgeon: Florencio Cara BIRCH, MD;  Location: [facility];  Service: Cardiovascular;  Laterality: N/A;   ESOPHAGOGASTRODUODENOSCOPY (EGD) WITH PROPOFOL  N/A 05/12/2015   Procedure: ESOPHAGOGASTRODUODENOSCOPY (EGD) WITH PROPOFOL ;  Surgeon: Rogelia Copping, MD;  Location: [facility];  Service: Endoscopy;  Laterality: N/A;  CPAP   KNEE ARTHROSCOPY Right 09/19/2014   Laminectomy posterior cervicle decomp     laminectomy posterior lumbar facetectomy &  Foraminotomy w/decomp  12/15/2012   LEFT HEART CATH AND CORONARY ANGIOGRAPHY Left 06/22/2023   Procedure: LEFT HEART CATH AND CORONARY ANGIOGRAPHY;  Surgeon: Florencio Cara BIRCH, MD;  Location: [facility];  Service: Cardiovascular;  Laterality: Left;   meniscus tear  2006   partial medial and extensive lateral meniscectomy     POLYPECTOMY  05/12/2015   Procedure: POLYPECTOMY;  Surgeon: Rogelia Copping, MD;  Location: [facility];  Service: Endoscopy;;   Social History   Occupational History   Not on file  Tobacco Use   Smoking status: Former    Passive exposure: Past   Smokeless tobacco: Current    Types: Snuff  Substance and Sexual Activity   Alcohol use: Yes    Alcohol/week: 0.0 standard drinks of alcohol    Comment: rarely - 1 beer/month   Drug use: No   Sexual activity: Not on file    Dorcas Melito Estela) Arlinda, M.D. [facility], Hand Surgery

## 2024-05-16 LAB — HEMOGLOBIN A1C: Hemoglobin A1C: 6.3

## 2024-05-29 ENCOUNTER — Encounter: Payer: Self-pay | Admitting: Family Medicine

## 2024-05-29 ENCOUNTER — Ambulatory Visit (INDEPENDENT_AMBULATORY_CARE_PROVIDER_SITE_OTHER): Admitting: Family Medicine

## 2024-05-29 VITALS — BP 114/73 | HR 76 | Temp 97.8°F | Resp 18 | Ht 70.0 in | Wt 255.0 lb

## 2024-05-29 DIAGNOSIS — I10 Essential (primary) hypertension: Secondary | ICD-10-CM | POA: Diagnosis not present

## 2024-05-29 DIAGNOSIS — E785 Hyperlipidemia, unspecified: Secondary | ICD-10-CM

## 2024-05-29 DIAGNOSIS — E1165 Type 2 diabetes mellitus with hyperglycemia: Secondary | ICD-10-CM | POA: Diagnosis not present

## 2024-05-29 DIAGNOSIS — F419 Anxiety disorder, unspecified: Secondary | ICD-10-CM | POA: Insufficient documentation

## 2024-05-29 DIAGNOSIS — E1140 Type 2 diabetes mellitus with diabetic neuropathy, unspecified: Secondary | ICD-10-CM | POA: Diagnosis not present

## 2024-05-29 DIAGNOSIS — Z23 Encounter for immunization: Secondary | ICD-10-CM

## 2024-05-29 DIAGNOSIS — M2240 Chondromalacia patellae, unspecified knee: Secondary | ICD-10-CM | POA: Insufficient documentation

## 2024-05-29 MED ORDER — CLOPIDOGREL BISULFATE 75 MG PO TABS: 75.0000 mg | ORAL_TABLET | Freq: Every day | ORAL | 1 refills | Status: AC

## 2024-05-29 MED ORDER — METOPROLOL TARTRATE 25 MG PO TABS: 25.0000 mg | ORAL_TABLET | Freq: Two times a day (BID) | ORAL | 1 refills | Status: DC

## 2024-05-29 MED ORDER — LISINOPRIL 20 MG PO TABS: 20.0000 mg | ORAL_TABLET | Freq: Every day | ORAL | 1 refills | Status: AC

## 2024-05-29 MED ORDER — ATORVASTATIN CALCIUM 40 MG PO TABS: 40.0000 mg | ORAL_TABLET | Freq: Every day | ORAL | 1 refills | Status: AC

## 2024-05-29 MED ORDER — HYDROXYZINE HCL 10 MG PO TABS: ORAL_TABLET | ORAL | 0 refills | Status: AC

## 2024-05-29 NOTE — Assessment & Plan Note (Signed)
 Discussed treatment options including benzodiazepines versus nonaddictive medications.  Will trial hydroxyzine 10 mg take 1 or 2 p.o. 3 times daily as needed stress.  Please follow-up in a month to reassess this.

## 2024-05-29 NOTE — Assessment & Plan Note (Signed)
 Followed by an endocrinologist.  Excellent control he is on Jardiance  25 mg daily, metformin  1000 twice daily Actos  30 mg daily and Tresiba liraglutide (100-3.6 units) 62 units once a day.

## 2024-05-29 NOTE — Progress Notes (Signed)
 Established Patient Office Visit  Subjective   Patient ID: Christopher Dennis, male    DOB: 12/24/1965  Age: 58 y.o. MRN: [id number]  Chief Complaint  Patient presents with   Medical Management of Chronic Issues    HPI Discussed the use of AI scribe software for clinical note transcription with the patient, who gave verbal consent to proceed.  History of Present Illness   Recardo Linn is a 58 year old male with diabetes and atrial fibrillation who presents with stress and anxiety.  He experiences episodes of 'little hot, cold sweats' attributed to stress and anxiety, with blood sugar levels at 109 mg/dL during these episodes. No chest pain or low blood pressure is noted. He feels increased stress related to worries about bills and his work as a Emergency planning/management officer. He has not previously taken medication for stress and consumes alcohol minimally, about once a week or a couple of times a month, typically one or two beers.  He does not smoke r use drugs.    He has a history of diabetes with a recent A1c of 6.3% and is currently on Ozempic 0.5 mg weekly without issues. Recent blood work showed ideal cholesterol levels and no proteinuria.  Total cholesterol is 120, trig 64, HDL 45 and LDL 54.  He has atrial fibrillation and is on Eliquis  twice a day and he had a coronary stent so he is also on Plavix  75 mg. No issues with current medications are reported.  Advised about dark and tarry stools.  He received injections in his thumbs from an orthopedic surgeon, which initially provided some relief, particularly in the right thumb. The pain varies with activity but is not currently bothersome.  He recently visited an eye doctor, and he recalls being told that his eyes look good. He is taking vitamin D  once a month.         ROS    Objective:     BP 114/73 (BP Location: Left Arm, Patient Position: Sitting, Cuff Size: Normal)   Pulse 76   Temp 97.8 F (36.6 C) (Oral)   Resp 18    Ht 5' 10 (1.778 m)   Wt 255 lb (115.7 kg)   SpO2 96%   BMI 36.59 kg/m    Physical Exam Vitals and nursing note reviewed.  Constitutional:      Appearance: Normal appearance.  HENT:     Head: Normocephalic and atraumatic.  Eyes:     Conjunctiva/sclera: Conjunctivae normal.  Cardiovascular:     Rate and Rhythm: Normal rate and regular rhythm.  Pulmonary:     Effort: Pulmonary effort is normal.     Breath sounds: Normal breath sounds.  Musculoskeletal:     Right lower leg: No edema.     Left lower leg: No edema.  Skin:    General: Skin is warm and dry.  Neurological:     Mental Status: He is alert and oriented to person, place, and time.  Psychiatric:        Mood and Affect: Mood normal.        Behavior: Behavior normal.        Thought Content: Thought content normal.        Judgment: Judgment normal.          Results for orders placed or performed in visit on 05/29/24  Hemoglobin A1c  Result Value Ref Range   Hemoglobin A1C 6.3       The ASCVD Risk score (Arnett DK,  et al., 2019) failed to calculate for the following reasons:   The valid total cholesterol range is 130 to 320 mg/dL    Assessment & Plan:  Uncontrolled type 2 diabetes mellitus with hyperglycemia, without long-term current use of insulin  (HCC) -     Lisinopril ; Take 1 tablet (20 mg total) by mouth daily.  Dispense: 90 tablet; Refill: 1 -     Metoprolol Tartrate; Take 1 tablet (25 mg total) by mouth 2 (two) times daily.  Dispense: 90 tablet; Refill: 1 -     Atorvastatin  Calcium ; Take 1 tablet (40 mg total) by mouth daily.  Dispense: 90 tablet; Refill: 1 -     Clopidogrel  Bisulfate; Take 1 tablet (75 mg total) by mouth daily.  Dispense: 90 tablet; Refill: 1  Primary hypertension -     Lisinopril ; Take 1 tablet (20 mg total) by mouth daily.  Dispense: 90 tablet; Refill: 1 -     Metoprolol Tartrate; Take 1 tablet (25 mg total) by mouth 2 (two) times daily.  Dispense: 90 tablet; Refill: 1 -      Atorvastatin  Calcium ; Take 1 tablet (40 mg total) by mouth daily.  Dispense: 90 tablet; Refill: 1 -     Clopidogrel  Bisulfate; Take 1 tablet (75 mg total) by mouth daily.  Dispense: 90 tablet; Refill: 1  Hyperlipidemia, unspecified hyperlipidemia type -     Clopidogrel  Bisulfate; Take 1 tablet (75 mg total) by mouth daily.  Dispense: 90 tablet; Refill: 1  Anxiety Assessment & Plan: Discussed treatment options including benzodiazepines versus nonaddictive medications.  Will trial hydroxyzine 10 mg take 1 or 2 p.o. 3 times daily as needed stress.  Please follow-up in a month to reassess this.  Orders: -     hydrOXYzine HCl; 1 or 2 by mouth TID for anxiety  Dispense: 60 tablet; Refill: 0  Immunization due -     Flu vaccine trivalent PF, 6mos and older(Flulaval,Afluria,Fluarix,Fluzone)  Type 2 diabetes mellitus with diabetic neuropathy, unspecified whether long term insulin  use (HCC) Assessment & Plan: Followed by an endocrinologist.  Excellent control he is on Jardiance  25 mg daily, metformin  1000 twice daily Actos  30 mg daily and Tresiba liraglutide (100-3.6 units) 62 units once a day.      Return in about 4 weeks (around 06/26/2024).    Taji Barretto K Nathanael Krist, MD

## 2024-06-13 ENCOUNTER — Ambulatory Visit: Admitting: Orthopedic Surgery

## 2024-06-13 DIAGNOSIS — M180 Bilateral primary osteoarthritis of first carpometacarpal joints: Secondary | ICD-10-CM | POA: Diagnosis not present

## 2024-06-13 NOTE — Progress Notes (Signed)
 Khan Chura - 58 y.o. male MRN [id number]  Date of birth: 1966-07-01  Office Visit Note: Visit Date: 06/13/2024 PCP: Ziglar, Susan K, MD Referred by: Ziglar, Susan K, MD  Subjective: No chief complaint on file.  HPI: Joni Norrod is a pleasant 58 y.o. male who returns today for follow-up of ongoing bilateral thumb CMC arthritis.  He underwent injections approximately 6 weeks prior with moderate relief of his symptoms.  Is also been utilizing Comfort Cool braces as instructed and as needed.  Pertinent ROS were reviewed with the patient and found to be negative unless otherwise specified above in HPI.    Assessment & Plan: Visit Diagnoses:  1. Arthritis of carpometacarpal (CMC) joint of both thumbs      Plan: Extensive discussion was had with the patient today regarding his bilateral thumb basilar joint pain.  X-rays from prior confirm diagnosis of ongoing bilateral thumb CMC arthritis which correlates with clinical examination.  We reviewed the etiology and pathophysiology of this condition as well as appropriate treatment modalities ranging from conservative to surgical.  From a conservative standpoint, we discussed bracing, activity modification, nonsteroidal anti-inflammatory medications both oral and topical, and finally cortisone injections.  From surgical standpoint, we discussed the possibility for bilateral, staged thumb CMC arthroplasty in the future should symptoms refractory to conservative care.  I discussed the surgical treatment as well as the postop protocol in detail with him as well today for understanding.  At this juncture, he would like to continue with conservative management which is appropriate.  I explained that we can perform injections approximately every 3 months or so.  Moving forward, he can contact me should symptoms recur or worsen in the future for further discussion regarding potential repeat injections or to discuss potential surgical  management.    Follow-up: No follow-ups on file.   Meds & Orders: No orders of the defined types were placed in this encounter.  No orders of the defined types were placed in this encounter.    Procedures: No procedures performed      Clinical History: No specialty comments available.  He reports that he has quit smoking. He has been exposed to tobacco smoke. His smokeless tobacco use includes snuff.  Recent Labs    05/16/24 0000  HGBA1C 6.3    Objective:   Vital Signs: There were no vitals taken for this visit.  Physical Exam  Gen: Well-appearing, in no acute distress; non-toxic CV: Regular Rate. Well-perfused. Warm.  Resp: Breathing unlabored on room air; no wheezing. Psych: Fluid speech in conversation; appropriate affect; normal thought process  Ortho Exam General: Patient is well appearing and in no distress.    Skin and Muscle: No skin changes are apparent to upper extremities.  Muscle bulk and contour normal, no signs of atrophy.      Range of Motion and Palpation Tests: Mobility is full about the elbows with flexion and extension.  Forearm supination and pronation are 85/85 bilaterally.  Wrist flexion/extension is 75/65 bilaterally.  Digital flexion and extension are full.  Thumb opposition is base of the ring finger bilaterally.  No cords or nodules are palpated.  No triggering is observed.     Significant tenderness over the bilateral thumb CMC articulation is observed, positive grind for pain, positive crepitus.  MP hyperextension negative right, approximate 10 degrees left.    Finklestein test mildly positive bilateral   Neurologic, Vascular, Motor: Sensation is intact to light touch in the median/radial/ulnar distributions Fingers pink  and well perfused.  Capillary refill is brisk.     Imaging: No results found. Prior bilateral thumb x-rays were reviewed which show significant degenerative change at the thumb CMC interval with joint space narrowing,  osteophyte formation and subchondral sclerosis  Past Medical/Family/Surgical/Social History: Medications & Allergies reviewed per EMR, new medications updated. 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   Paroxysmal atrial fibrillation (HCC) 03/08/2021   Hypercalcemia 08/01/2019   Vitamin D  deficiency 08/01/2019   Uncontrolled type 2 diabetes mellitus with hyperglycemia, without long-term current use of insulin  (HCC) 03/02/2018   Stricture and stenosis of esophagus    Special screening for malignant neoplasms, colon    DDD (degenerative disc disease), lumbosacral 12/11/2012   Thoracic or lumbosacral neuritis or radiculitis, unspecified 12/11/2012   Low back pain 12/04/2012   Atherosclerotic heart disease of native coronary artery without angina pectoris 12/04/2012   Clinical depression 12/04/2012   Elevated LFTs 12/04/2012   Essential hypertension with goal blood pressure less than 130/80 12/04/2012   HLD (hyperlipidemia) 12/04/2012   Hyponatremia 12/04/2012   Diabetes mellitus (HCC) 12/04/2012   Past Medical History:  Diagnosis Date   Abnormal LFTs 12/04/2012   Arteriosclerosis of coronary artery 12/04/2012   Back ache 12/04/2012   BP (high blood pressure) 12/04/2012   Clinical depression 12/04/2012   DDD (degenerative disc disease), lumbosacral 12/11/2012   Diabetes (HCC)    Displacement of lumbar intervertebral disc  without myelopathy 12/11/2012   HLD (hyperlipidemia) 12/04/2012   Lumbar canal stenosis 12/11/2012   Neuropathy 12/04/2012   Sleep apnea    CPAP - hasn't been using, no sure if it works   IT consultant and lumbosacral neuritis 12/11/2012   Family History  Problem Relation Age of Onset   Diabetes Mother    COPD Mother    Alcohol abuse Father    Asthma Sister    Past Surgical History:  Procedure Laterality Date   BACK SURGERY  2000   COLONOSCOPY WITH PROPOFOL  N/A 05/12/2015   Procedure: COLONOSCOPY WITH PROPOFOL ;  Surgeon: Rogelia Copping, MD;  Location: [facility];  Service: Endoscopy;  Laterality: N/A;   CORONARY ANGIOPLASTY WITH STENT PLACEMENT  06/17/2011   CORONARY STENT INTERVENTION N/A 06/22/2023   Procedure: CORONARY STENT INTERVENTION;  Surgeon: Florencio Cara BIRCH, MD;  Location: [facility];  Service: Cardiovascular;  Laterality: N/A;   ESOPHAGOGASTRODUODENOSCOPY (EGD) WITH PROPOFOL  N/A 05/12/2015   Procedure: ESOPHAGOGASTRODUODENOSCOPY (EGD) WITH PROPOFOL ;  Surgeon: Rogelia Copping, MD;  Location: [facility];  Service: Endoscopy;  Laterality: N/A;  CPAP   KNEE ARTHROSCOPY Right 09/19/2014   Laminectomy posterior cervicle decomp     laminectomy posterior lumbar facetectomy & Foraminotomy w/decomp  12/15/2012   LEFT HEART CATH AND CORONARY ANGIOGRAPHY Left 06/22/2023   Procedure: LEFT HEART CATH AND CORONARY ANGIOGRAPHY;  Surgeon: Florencio Cara BIRCH, MD;  Location: [facility];  Service: Cardiovascular;  Laterality: Left;   meniscus tear  2006   partial medial and extensive lateral meniscectomy     POLYPECTOMY  05/12/2015   Procedure: POLYPECTOMY;  Surgeon: Rogelia Copping, MD;  Location: [facility];  Service: Endoscopy;;   Social History   Occupational History   Not on file  Tobacco Use   Smoking status: Former    Passive exposure: Past   Smokeless tobacco: Current    Types: Snuff  Substance and Sexual Activity   Alcohol use: Yes     Alcohol/week: 0.0 standard drinks of alcohol    Comment: rarely - 1 beer/month   Drug use: No   Sexual activity: Not on file    Kytzia Gienger Estela) Arlinda, M.D. [facility], Hand Surgery

## 2024-06-25 ENCOUNTER — Encounter: Payer: Self-pay | Admitting: Radiology

## 2024-06-29 ENCOUNTER — Other Ambulatory Visit: Payer: Self-pay

## 2024-06-29 ENCOUNTER — Ambulatory Visit: Admitting: Family Medicine

## 2024-07-05 ENCOUNTER — Telehealth: Payer: Self-pay

## 2024-07-05 NOTE — Telephone Encounter (Signed)
 LMOM for pt to CB and stop by the office to sign for his comfort cool braces

## 2024-08-27 DIAGNOSIS — I10 Essential (primary) hypertension: Secondary | ICD-10-CM

## 2024-08-27 DIAGNOSIS — E1165 Type 2 diabetes mellitus with hyperglycemia: Secondary | ICD-10-CM

## 2024-09-27 ENCOUNTER — Emergency Department

## 2024-09-27 ENCOUNTER — Emergency Department
Admission: EM | Admit: 2024-09-27 | Discharge: 2024-09-27 | Disposition: A | Discharge status: Home / Self Care | Attending: Emergency Medicine | Admitting: Emergency Medicine

## 2024-09-27 ENCOUNTER — Other Ambulatory Visit: Payer: Self-pay

## 2024-09-27 DIAGNOSIS — Z7984 Long term (current) use of oral hypoglycemic drugs: Secondary | ICD-10-CM | POA: Insufficient documentation

## 2024-09-27 DIAGNOSIS — Z79899 Other long term (current) drug therapy: Secondary | ICD-10-CM | POA: Insufficient documentation

## 2024-09-27 DIAGNOSIS — Z794 Long term (current) use of insulin: Secondary | ICD-10-CM | POA: Insufficient documentation

## 2024-09-27 DIAGNOSIS — I10 Essential (primary) hypertension: Secondary | ICD-10-CM | POA: Insufficient documentation

## 2024-09-27 DIAGNOSIS — R079 Chest pain, unspecified: Secondary | ICD-10-CM | POA: Insufficient documentation

## 2024-09-27 DIAGNOSIS — I2510 Atherosclerotic heart disease of native coronary artery without angina pectoris: Secondary | ICD-10-CM | POA: Insufficient documentation

## 2024-09-27 DIAGNOSIS — E119 Type 2 diabetes mellitus without complications: Secondary | ICD-10-CM | POA: Insufficient documentation

## 2024-09-27 LAB — TROPONIN T, HIGH SENSITIVITY
Troponin T High Sensitivity: 19 ng/L (ref 0–19)
Troponin T High Sensitivity: 19 ng/L (ref 0–19)

## 2024-09-27 LAB — CBC
HCT: 42.2 % (ref 39.0–52.0)
Hemoglobin: 13.8 g/dL (ref 13.0–17.0)
MCH: 30.8 pg (ref 26.0–34.0)
MCHC: 32.7 g/dL (ref 30.0–36.0)
MCV: 94.2 fL (ref 80.0–100.0)
Platelets: 190 10*3/uL (ref 150–400)
RBC: 4.48 MIL/uL (ref 4.22–5.81)
RDW: 13.3 % (ref 11.5–15.5)
WBC: 6.9 10*3/uL (ref 4.0–10.5)
nRBC: 0 % (ref 0.0–0.2)

## 2024-09-27 LAB — BASIC METABOLIC PANEL WITH GFR
Anion gap: 12 (ref 5–15)
BUN: 25 mg/dL — ABNORMAL HIGH (ref 6–20)
CO2: 22 mmol/L (ref 22–32)
Calcium: 9.5 mg/dL (ref 8.9–10.3)
Chloride: 104 mmol/L (ref 98–111)
Creatinine, Ser: 1.01 mg/dL (ref 0.61–1.24)
GFR, Estimated: >60 mL/min
Glucose, Bld: 189 mg/dL — ABNORMAL HIGH (ref 70–99)
Potassium: 4.9 mmol/L (ref 3.5–5.1)
Sodium: 138 mmol/L (ref 135–145)

## 2024-09-27 NOTE — ED Notes (Signed)
Patient discharged at this time. Ambulated to lobby with independent and steady gait. Breathing unlabored speaking in full sentences. Verbalized understanding of all discharge, follow up, and medication teaching. Discharged homed with all belongings.   

## 2024-09-27 NOTE — ED Provider Notes (Signed)
 "  [facility] Provider Note    Event Date/Time   First MD Initiated Contact with Patient 09/27/24 0430     (approximate)   History   Chest Pain   HPI  Christopher Dennis is a 59 y.o. male with history of hypertension, diabetes, hyperlipidemia, sleep apnea who does not use his CPAP, CAD status post stent who presents to the emergency department with chest pain and shortness of breath.  States that he went to bed in his normal state of health and woke up in the middle of night with chest pain, shortness of breath and checked his heart rate and it was in the 130s.  States his symptoms have now currently completely resolved and he is feeling back to baseline.  He denies any fevers, cough, vomiting or diarrhea.  No history of PE, DVT, exogenous estrogen use, recent fractures, surgery, trauma, hospitalization, prolonged travel or other immobilization. No lower extremity swelling or pain. No calf tenderness.  Reports he did have an episode of atrial fibrillation once.  He is on Eliquis  and metoprolol .   History provided by patient and wife.    Past Medical History:  Diagnosis Date   Abnormal LFTs 12/04/2012   Arteriosclerosis of coronary artery 12/04/2012   Back ache 12/04/2012   BP (high blood pressure) 12/04/2012   Clinical depression 12/04/2012   DDD (degenerative disc disease), lumbosacral 12/11/2012   Diabetes (HCC)    Displacement of lumbar intervertebral disc without myelopathy 12/11/2012   HLD (hyperlipidemia) 12/04/2012   Lumbar canal stenosis 12/11/2012   Neuropathy 12/04/2012   Sleep apnea    CPAP - hasn't been using, no sure if it works   It Consultant and lumbosacral neuritis 12/11/2012    Past Surgical History:  Procedure Laterality Date   BACK SURGERY  2000   COLONOSCOPY WITH PROPOFOL  N/A 05/12/2015   Procedure: COLONOSCOPY WITH PROPOFOL ;  Surgeon: Rogelia Copping, MD;  Location: [facility];  Service: Endoscopy;  Laterality: N/A;   CORONARY  ANGIOPLASTY WITH STENT PLACEMENT  06/17/2011   CORONARY STENT INTERVENTION N/A 06/22/2023   Procedure: CORONARY STENT INTERVENTION;  Surgeon: Florencio Cara BIRCH, MD;  Location: [facility];  Service: Cardiovascular;  Laterality: N/A;   ESOPHAGOGASTRODUODENOSCOPY (EGD) WITH PROPOFOL  N/A 05/12/2015   Procedure: ESOPHAGOGASTRODUODENOSCOPY (EGD) WITH PROPOFOL ;  Surgeon: Rogelia Copping, MD;  Location: [facility];  Service: Endoscopy;  Laterality: N/A;  CPAP   KNEE ARTHROSCOPY Right 09/19/2014   Laminectomy posterior cervicle decomp     laminectomy posterior lumbar facetectomy & Foraminotomy w/decomp  12/15/2012   LEFT HEART CATH AND CORONARY ANGIOGRAPHY Left 06/22/2023   Procedure: LEFT HEART CATH AND CORONARY ANGIOGRAPHY;  Surgeon: Florencio Cara BIRCH, MD;  Location: [facility];  Service: Cardiovascular;  Laterality: Left;   meniscus tear  2006   partial medial and extensive lateral meniscectomy     POLYPECTOMY  05/12/2015   Procedure: POLYPECTOMY;  Surgeon: Rogelia Copping, MD;  Location: [facility];  Service: Endoscopy;;    MEDICATIONS:  Prior to Admission medications  Medication Sig Start Date End Date Taking? Authorizing Provider  apixaban  (ELIQUIS ) 5 MG TABS tablet Take 1 tablet (5 mg total) by mouth 2 (two) times daily. 03/09/21   Laurita Pillion, MD  atorvastatin  (LIPITOR) 40 MG tablet Take 1 tablet (40 mg total) by mouth daily. 05/29/24   Ziglar, Susan K, MD  clopidogrel  (PLAVIX ) 75 MG tablet Take 1 tablet (75 mg total) by mouth daily. 05/29/24   Ziglar, Susan  K, MD  Continuous Glucose Sensor (FREESTYLE LIBRE 3 PLUS SENSOR) MISC Place 1 each onto the skin every 14 (fourteen) days.    [provider]  empagliflozin  (JARDIANCE ) 25 MG TABS tablet Take 25 mg by mouth daily.     [provider]  hydrOXYzine  (ATARAX ) 10 MG tablet 1 or 2 by mouth TID for anxiety 05/29/24   Ziglar, Susan K, MD  Insulin  Degludec-Liraglutide (XULTOPHY) 100-3.6 UNIT-MG/ML SOPN  Inject 50 Units into the skin at bedtime.     [provider]  lisinopril  (ZESTRIL ) 20 MG tablet Take 1 tablet (20 mg total) by mouth daily. 05/29/24   Ziglar, Susan K, MD  meloxicam  (MOBIC ) 15 MG tablet Take 1 tablet (15 mg total) by mouth daily. 04/10/24   Henry Slater NOVAK, PA-C  metFORMIN  (GLUCOPHAGE ) 1000 MG tablet Take 1,000 mg by mouth 2 (two) times daily.     [provider]  metoprolol  tartrate (LOPRESSOR ) 25 MG tablet TAKE 1 TABLET(25 MG) BY MOUTH TWICE DAILY 08/27/24   Ziglar, Susan K, MD  OZEMPIC, 0.25 OR 0.5 MG/DOSE, 2 MG/1.5ML SOPN Inject 0.5 mg into the skin. 03/05/21   [provider]  pioglitazone  (ACTOS ) 30 MG tablet Take 30 mg by mouth daily.     [provider]  TRESIBA FLEXTOUCH 200 UNIT/ML FlexTouch Pen Inject 62 Units into the skin daily. 06/15/24   [provider]  Vitamin D , Ergocalciferol , (DRISDOL ) 1.25 MG (50000 UT) CAPS capsule Take 50,000 Units by mouth once a week.     [provider]    Physical Exam   Triage Vital Signs: ED Triage Vitals  Encounter Vitals Group     BP 09/27/24 0202 128/79     Girls Systolic BP Percentile --      Girls Diastolic BP Percentile --      Boys Systolic BP Percentile --      Boys Diastolic BP Percentile --      Pulse Rate 09/27/24 0202 82     Resp 09/27/24 0202 18     Temp 09/27/24 0202 97.9 F (36.6 C)     Temp Source 09/27/24 0202 Oral     SpO2 09/27/24 0202 99 %     Weight 09/27/24 0200 250 lb (113.4 kg)     Height 09/27/24 0200 5' 11 (1.803 m)     Head Circumference --      Peak Flow --      Pain Score 09/27/24 0200 4     Pain Loc --      Pain Education --      Exclude from Growth Chart --     Most recent vital signs: Vitals:   09/27/24 0202  BP: 128/79  Pulse: 82  Resp: 18  Temp: 97.9 F (36.6 C)  SpO2: 99%    CONSTITUTIONAL: Alert, responds appropriately to questions. Well-appearing; well-nourished HEAD: Normocephalic, atraumatic EYES: Conjunctivae clear,  pupils appear equal, sclera nonicteric ENT: normal nose; moist mucous membranes NECK: Supple, normal ROM CARD: RRR; S1 and S2 appreciated RESP: Normal chest excursion without splinting or tachypnea; breath sounds clear and equal bilaterally; no wheezes, no rhonchi, no rales, no hypoxia or respiratory distress, speaking full sentences ABD/GI: Non-distended; soft, non-tender, no rebound, no guarding, no peritoneal signs BACK: The back appears normal EXT: Normal ROM in all joints; no deformity noted, no edema, no calf tenderness or calf swelling SKIN: Normal color for age and race; warm; no rash on exposed skin NEURO: Moves all extremities equally, normal speech  PSYCH: The patient's mood and manner are appropriate.   ED Results / Procedures / Treatments   LABS: (all labs ordered are listed, but only abnormal results are displayed) Labs Reviewed  BASIC METABOLIC PANEL WITH GFR - Abnormal; Notable for the following components:      Result Value   Glucose, Bld 189 (*)    BUN 25 (*)    All other components within normal limits  CBC  TROPONIN T, HIGH SENSITIVITY  TROPONIN T, HIGH SENSITIVITY     EKG:  EKG Interpretation Date/Time:  Thursday September 27 2024 02:00:49 EST Ventricular Rate:  84 PR Interval:  132 QRS Duration:  94 QT Interval:  366 QTC Calculation: 432 R Axis:   -9  Text Interpretation: Normal sinus rhythm Incomplete right bundle branch block Inferior infarct , age undetermined Possible Anterior infarct , age undetermined Abnormal ECG When compared with ECG of 23-Jun-2023 04:56, Borderline criteria for Anterior infarct are now Present No significant change was found Confirmed by Neomi Neptune [phone]) on 09/27/2024 4:30:47 AM         RADIOLOGY: My personal review and interpretation of imaging: Chest x-ray clear.  I have personally reviewed all radiology reports.   DG Chest 2 View Result Date: 09/27/2024 EXAM: 2 VIEW(S) XRAY OF THE CHEST 09/27/2024 02:21:46 AM  COMPARISON: None available. CLINICAL HISTORY: Chest pain. FINDINGS: LUNGS AND PLEURA: Low lung volumes. No focal pulmonary opacity. No pleural effusion. No pneumothorax. HEART AND MEDIASTINUM: No acute abnormality of the cardiac and mediastinal silhouettes. BONES AND SOFT TISSUES: Cervical fixation hardware. IMPRESSION: 1. No acute cardiopulmonary findings. Electronically signed by: Greig Pique MD 09/27/2024 02:47 AM EST RP Workstation: [id number]     PROCEDURES:  Critical Care performed: No     .1-3 Lead EKG Interpretation  Performed by: Jatia Musa, Neptune SAILOR, DO Authorized by: Hank Walling, Neptune SAILOR, DO     Interpretation: normal     ECG rate:  82   ECG rate assessment: normal     Rhythm: sinus rhythm     Ectopy: none     Conduction: normal       IMPRESSION / MDM / ASSESSMENT AND PLAN / ED COURSE  I reviewed the triage vital signs and the nursing notes.    Patient here with complaints of chest pain, shortness of breath, tachycardia.  The patient is on the cardiac monitor to evaluate for evidence of arrhythmia and/or significant heart rate changes.   DIFFERENTIAL DIAGNOSIS (includes but not limited to):   ACS, cardiac arrhythmia, sleep apnea, less likely PE, doubt CHF, pneumonia, dissection, thyroid  storm, thyrotoxicosis   Patient's presentation is most consistent with acute presentation with potential threat to life or bodily function.   PLAN: Will obtain labs, EKG, chest x-ray.  Patient now asymptomatic.  Normal heart rate.  We discussed that symptoms could have been due to sleep apnea especially given he does not wear his CPAP at night.  He also has a history of A-fib and is on metoprolol  and Eliquis .  Currently in sinus rhythm but we discussed arrhythmia being a possibility today.  Also concern for ACS given his cardiac history.  Plan will be to get 2 troponins.  I am not concerned for PE at this time given he is asymptomatic without tachycardia, tachypnea or hypoxia.  No calf  tenderness or calf swelling.  No risk factors for PE other than age.   MEDICATIONS GIVEN IN ED: Medications - No data to display   ED COURSE: EKG shows no new ischemic  change, arrhythmia or interval abnormality.  Normal potassium, hemoglobin.  Negative troponin x 2.  Chest x-ray reviewed and interpreted by myself and the radiologist.  Patient continues to be asymptomatic and hemodynamically stable.  At this time I do feel he is safe for discharge home and recommended close follow-up with his cardiologist.  He is comfortable with this plan as well.   At this time, I do not feel there is any life-threatening condition present. I reviewed all nursing notes, vitals, pertinent previous records.  All lab and urine results, EKGs, imaging ordered have been independently reviewed and interpreted by myself.  I reviewed all available radiology reports from any imaging ordered this visit.  Based on my assessment, I feel the patient is safe to be discharged home without further emergent workup and can continue workup as an outpatient as needed. Discussed all findings, treatment plan as well as usual and customary return precautions.  They verbalize understanding and are comfortable with this plan.  Outpatient follow-up has been provided as needed.  All questions have been answered.    CONSULTS:  none   OUTSIDE RECORDS REVIEWED: Reviewed recent family medicine notes       FINAL CLINICAL IMPRESSION(S) / ED DIAGNOSES   Final diagnoses:  Nonspecific chest pain     Rx / DC Orders   ED Discharge Orders     None        Note:  This document was prepared using Dragon voice recognition software and may include unintentional dictation errors.   Emileigh Kellett, Josette SAILOR, DO 09/27/24 [phone]  "

## 2024-09-27 NOTE — ED Triage Notes (Signed)
 Pt reports sudden onset of chest pain and shortness of breath while sleeping tonight. Pt reports he had cardiac stents placed in oct 2024. Pt denies cough or congestion.

## 2024-09-27 NOTE — Discharge Instructions (Signed)
 Please contact your cardiologist for close outpatient follow-up.  Your cardiac test today, EKG and chest x-ray were reassuring.
